# Patient Record
Sex: Female | Born: 1970 | Race: White | Hispanic: No | Smoking: Current every day smoker
Health system: Southern US, Community
[De-identification: ages and names within clinical notes are randomized; demographics above are authoritative.]

## PROBLEM LIST (undated history)

## (undated) ENCOUNTER — Inpatient Hospital Stay (HOSPITAL_COMMUNITY): Payer: Self-pay

## (undated) DIAGNOSIS — O039 Complete or unspecified spontaneous abortion without complication: Secondary | ICD-10-CM

## (undated) DIAGNOSIS — Z8489 Family history of other specified conditions: Secondary | ICD-10-CM

## (undated) DIAGNOSIS — N390 Urinary tract infection, site not specified: Secondary | ICD-10-CM

## (undated) HISTORY — PX: INDUCED ABORTION: SHX677

---

## 1997-05-10 ENCOUNTER — Observation Stay (HOSPITAL_COMMUNITY): Admission: AD | Admit: 1997-05-10 | Discharge: 1997-05-11 | Payer: Self-pay | Admitting: Obstetrics

## 1998-10-05 ENCOUNTER — Encounter: Payer: Self-pay | Admitting: *Deleted

## 1998-10-05 ENCOUNTER — Inpatient Hospital Stay (HOSPITAL_COMMUNITY): Admission: AD | Admit: 1998-10-05 | Discharge: 1998-10-08 | Payer: Self-pay | Admitting: *Deleted

## 1998-10-16 ENCOUNTER — Inpatient Hospital Stay (HOSPITAL_COMMUNITY): Admission: AD | Admit: 1998-10-16 | Discharge: 1998-10-17 | Payer: Self-pay | Admitting: Obstetrics & Gynecology

## 1998-10-16 ENCOUNTER — Encounter (INDEPENDENT_AMBULATORY_CARE_PROVIDER_SITE_OTHER): Payer: Self-pay | Admitting: Specialist

## 2002-08-06 ENCOUNTER — Emergency Department (HOSPITAL_COMMUNITY): Admission: EM | Admit: 2002-08-06 | Discharge: 2002-08-06 | Payer: Self-pay | Admitting: Emergency Medicine

## 2003-10-05 ENCOUNTER — Inpatient Hospital Stay (HOSPITAL_COMMUNITY): Admission: AD | Admit: 2003-10-05 | Discharge: 2003-10-07 | Payer: Self-pay | Admitting: Family Medicine

## 2006-02-28 HISTORY — PX: KNEE CARTILAGE SURGERY: SHX688

## 2006-03-14 ENCOUNTER — Emergency Department (HOSPITAL_COMMUNITY): Admission: EM | Admit: 2006-03-14 | Discharge: 2006-03-14 | Payer: Self-pay | Admitting: Emergency Medicine

## 2006-03-24 ENCOUNTER — Inpatient Hospital Stay (HOSPITAL_COMMUNITY): Admission: RE | Admit: 2006-03-24 | Discharge: 2006-03-28 | Payer: Self-pay | Admitting: Orthopedic Surgery

## 2006-05-10 ENCOUNTER — Encounter: Admission: RE | Admit: 2006-05-10 | Discharge: 2006-08-08 | Payer: Self-pay | Admitting: Orthopedic Surgery
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
## 2007-08-26 IMAGING — CR DG KNEE 1-2V PORT*L*
2 series · 2 of 2 positions shown · non-contrast
Comparison: none

CLINICAL DATA: Status post fixation of tibial fracture.
 PORTABLE LEFT KNEE ? 2 VIEWS ? 03/24/06:

[view not recorded (1 of 2)]
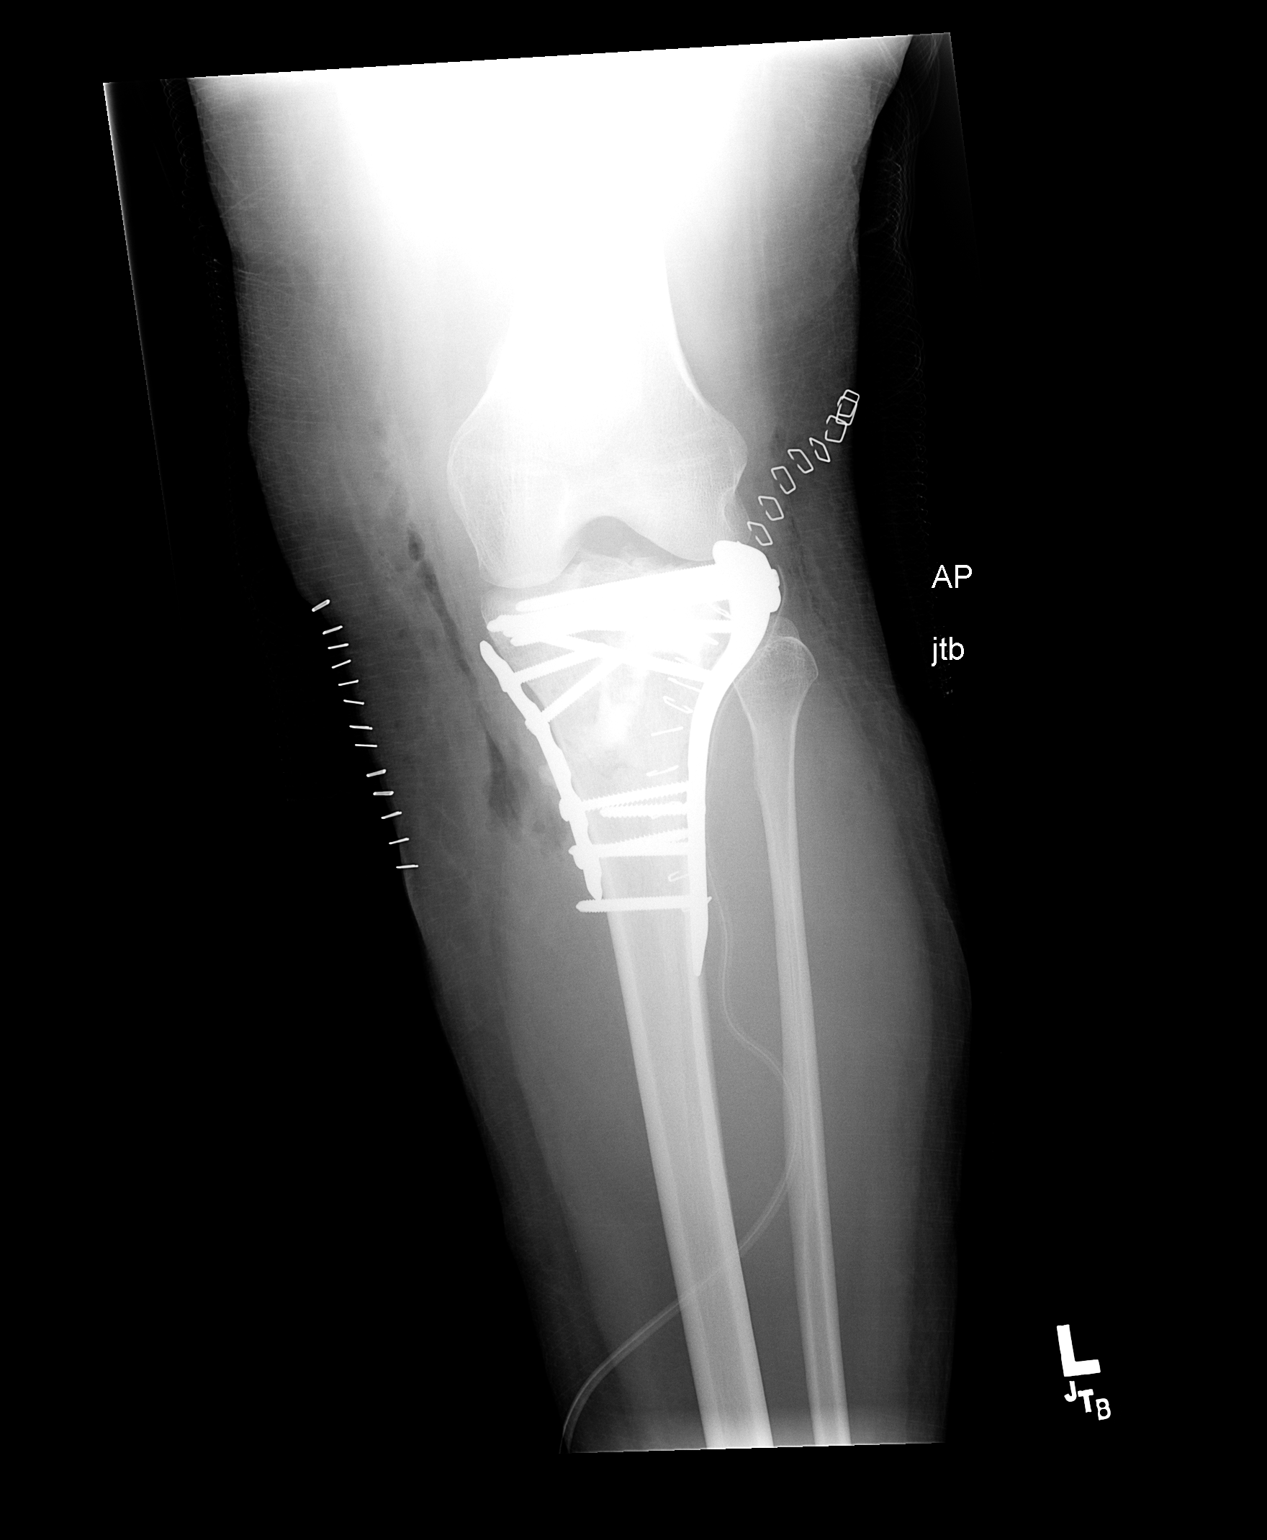

[view not recorded (2 of 2)]
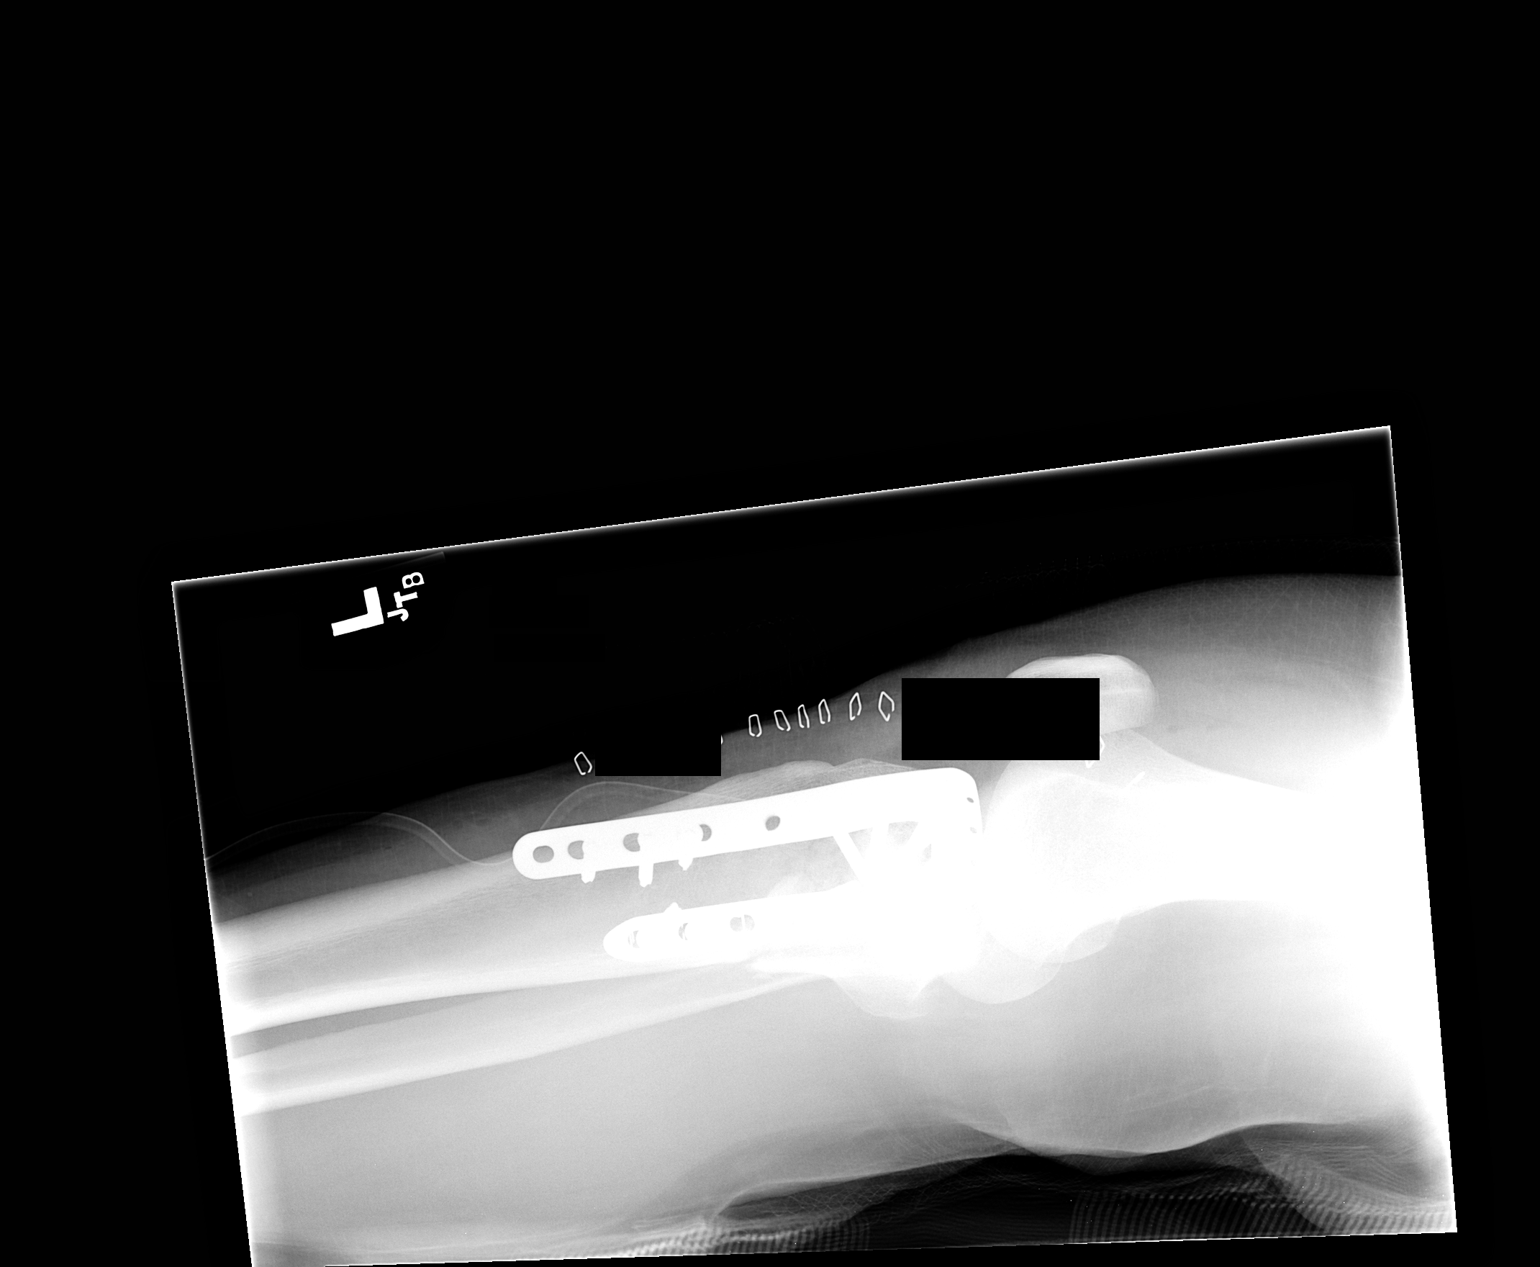

[2 of 2 positions shown; findings below may reference images not displayed]

FINDINGS: AP and lateral views of the left knee demonstrate medial and lateral buttress plate and screws fixing a comminuted tibial plateau fracture.  A surgical drain and stables are in place.
IMPRESSION: Status post ORIF of tibial plateau fracture.

## 2007-10-30 ENCOUNTER — Emergency Department (HOSPITAL_COMMUNITY): Admission: EM | Admit: 2007-10-30 | Discharge: 2007-10-30 | Payer: Self-pay | Admitting: Emergency Medicine

## 2008-04-28 ENCOUNTER — Emergency Department (HOSPITAL_COMMUNITY): Admission: EM | Admit: 2008-04-28 | Discharge: 2008-04-28 | Payer: Self-pay | Admitting: Emergency Medicine

## 2008-05-27 ENCOUNTER — Emergency Department (HOSPITAL_COMMUNITY): Admission: EM | Admit: 2008-05-27 | Discharge: 2008-05-27 | Payer: Self-pay | Admitting: Emergency Medicine

## 2008-06-20 ENCOUNTER — Emergency Department (HOSPITAL_COMMUNITY): Admission: EM | Admit: 2008-06-20 | Discharge: 2008-06-20 | Payer: Self-pay | Admitting: Emergency Medicine

## 2008-07-02 ENCOUNTER — Emergency Department (HOSPITAL_COMMUNITY): Admission: EM | Admit: 2008-07-02 | Discharge: 2008-07-02 | Payer: Self-pay | Admitting: Emergency Medicine

## 2008-07-18 ENCOUNTER — Other Ambulatory Visit: Admission: RE | Admit: 2008-07-18 | Discharge: 2008-07-18 | Payer: Self-pay | Admitting: Obstetrics & Gynecology

## 2008-09-18 ENCOUNTER — Ambulatory Visit: Payer: Self-pay | Admitting: Advanced Practice Midwife

## 2008-09-18 ENCOUNTER — Inpatient Hospital Stay (HOSPITAL_COMMUNITY): Admission: AD | Admit: 2008-09-18 | Discharge: 2008-09-18 | Payer: Self-pay | Admitting: Obstetrics & Gynecology

## 2008-10-02 ENCOUNTER — Ambulatory Visit (HOSPITAL_COMMUNITY): Admission: RE | Admit: 2008-10-02 | Discharge: 2008-10-02 | Payer: Self-pay | Admitting: Obstetrics & Gynecology

## 2008-11-03 ENCOUNTER — Emergency Department (HOSPITAL_COMMUNITY): Admission: EM | Admit: 2008-11-03 | Discharge: 2008-11-03 | Payer: Self-pay | Admitting: Emergency Medicine

## 2009-01-09 ENCOUNTER — Inpatient Hospital Stay (HOSPITAL_COMMUNITY): Admission: AD | Admit: 2009-01-09 | Discharge: 2009-01-09 | Payer: Self-pay | Admitting: Obstetrics and Gynecology

## 2009-01-09 ENCOUNTER — Ambulatory Visit: Payer: Self-pay | Admitting: Physician Assistant

## 2009-01-18 ENCOUNTER — Inpatient Hospital Stay (HOSPITAL_COMMUNITY): Admission: AD | Admit: 2009-01-18 | Discharge: 2009-01-20 | Payer: Self-pay | Admitting: Obstetrics and Gynecology

## 2009-11-22 IMAGING — US US OB TRANSVAGINAL MODIFY
1 series · 14 of 28 positions shown · non-contrast
Comparison: None

CLINICAL DATA: Early pregnancy.  Vaginal bleeding.  Uncertain
menstrual dates.

OBSTETRIC <14 WK US AND TRANSVAGINAL OB US
TECHNIQUE: Both transabdominal and transvaginal ultrasound
examinations were performed for complete evaluation of the
gestation as well as the maternal uterus, adnexal regions, and
pelvic cul-de-sac.

[Series 1: us ob transvaginal modify · 0.28mm/px · 14 of 66 slices shown]
[im 3/66]
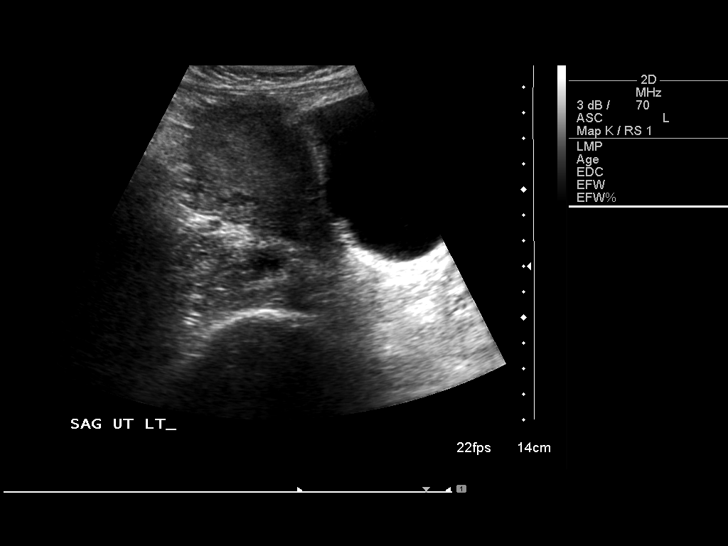
[im 8/66]
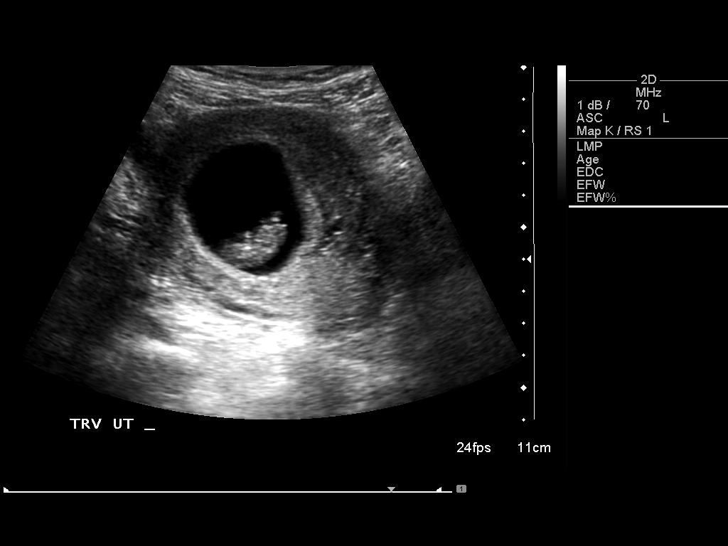
[im 13/66]
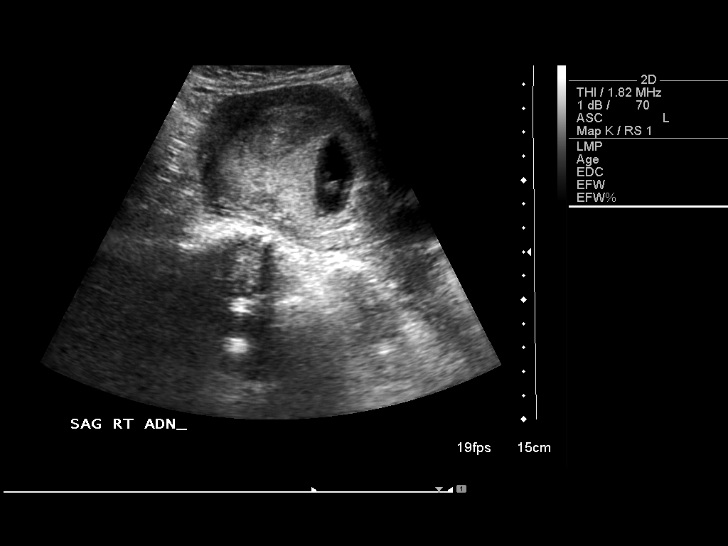
[im 17/66]
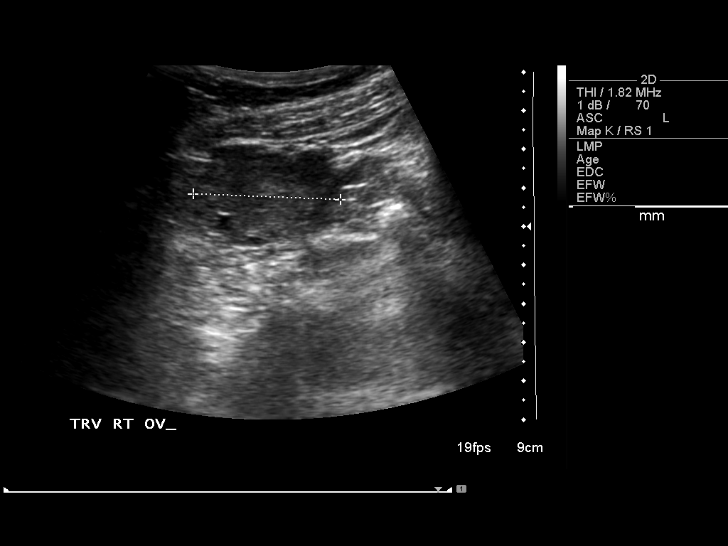
[im 22/66]
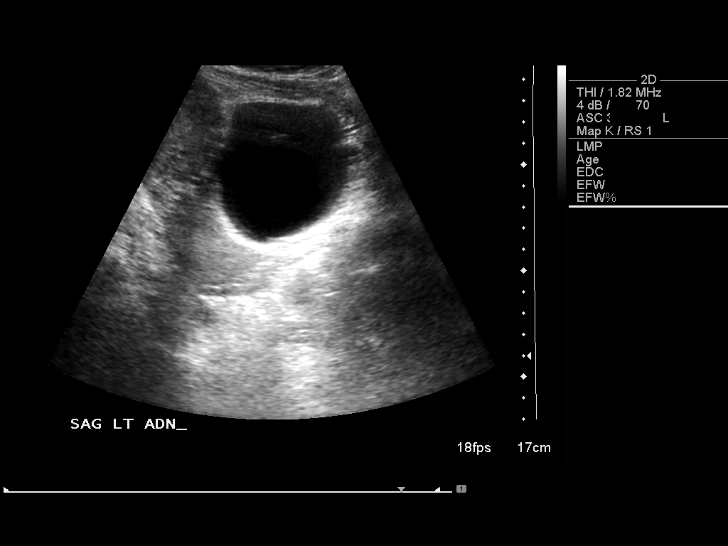
[im 27/66]
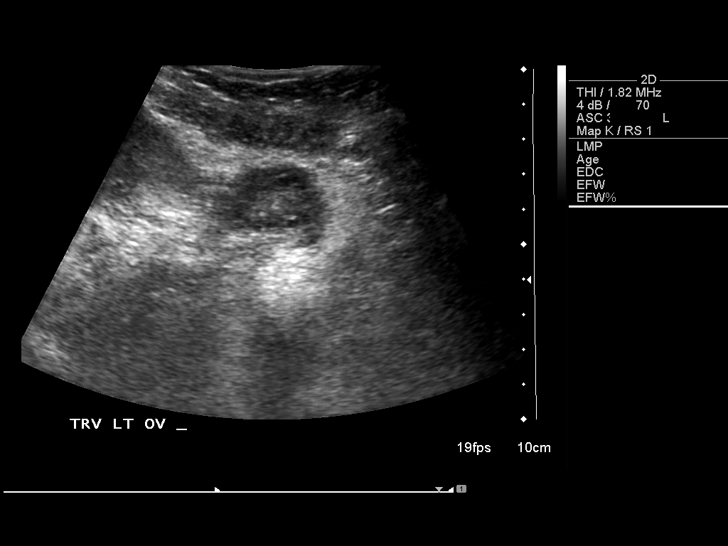
[im 32/66]
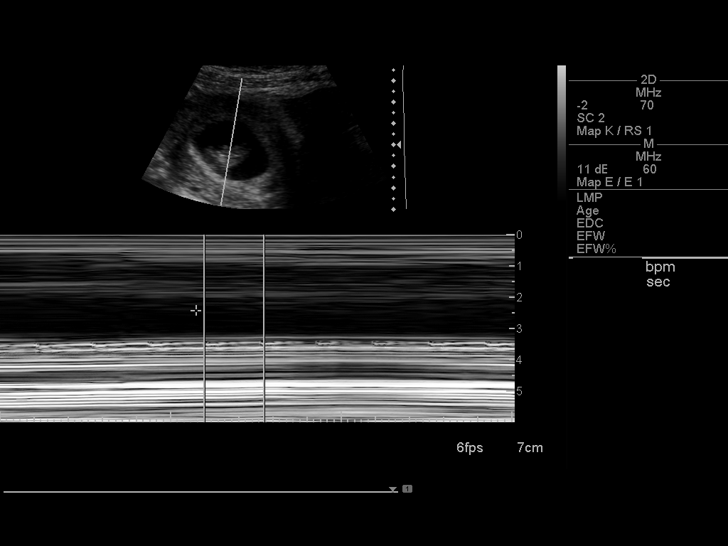
[im 37/66]
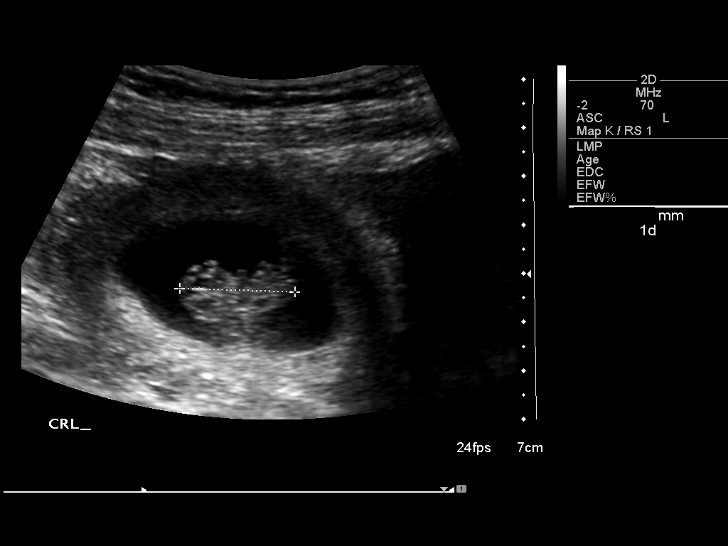
[im 41/66]
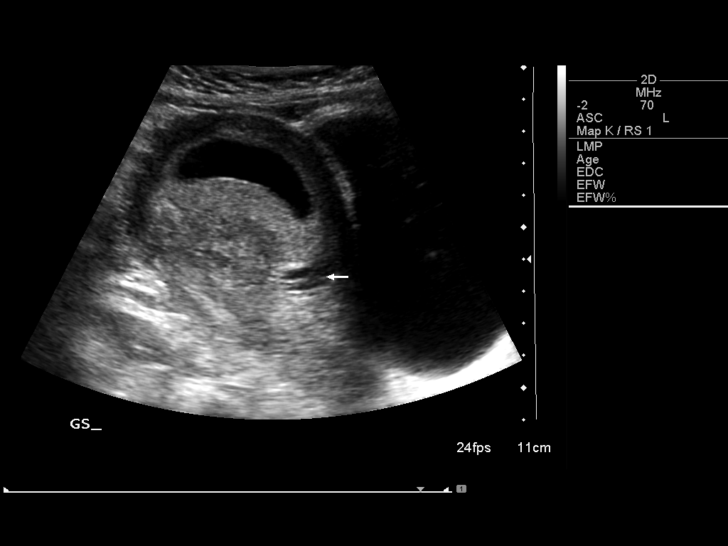
[im 46/66]
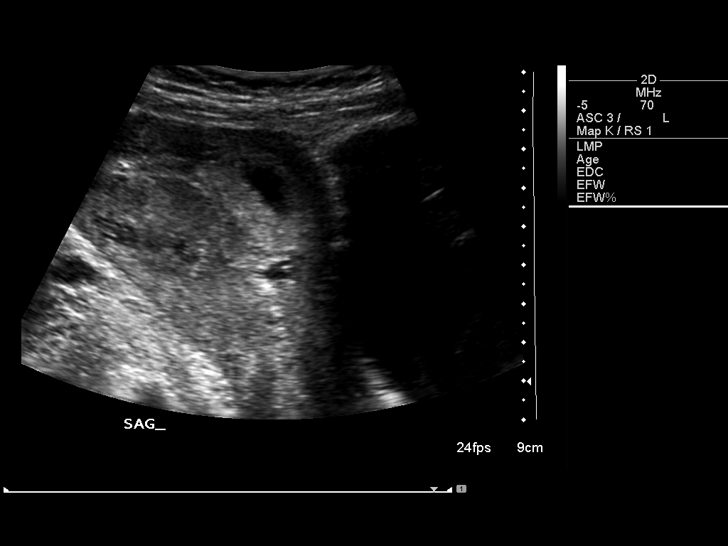
[im 51/66]
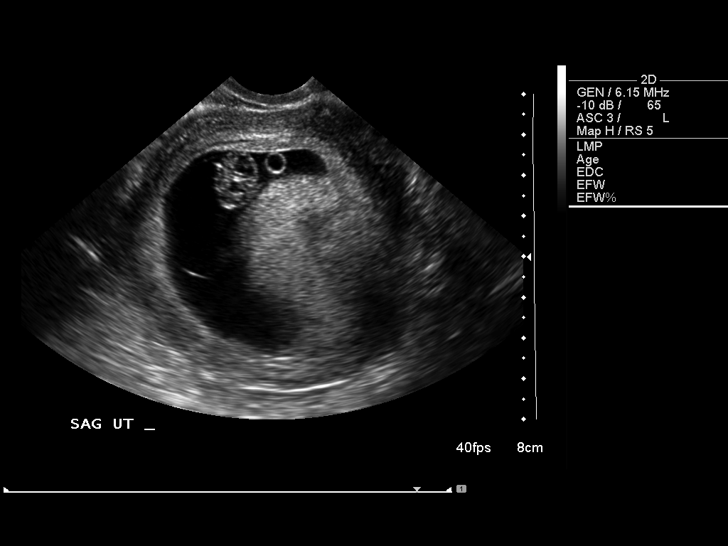
[im 56/66]
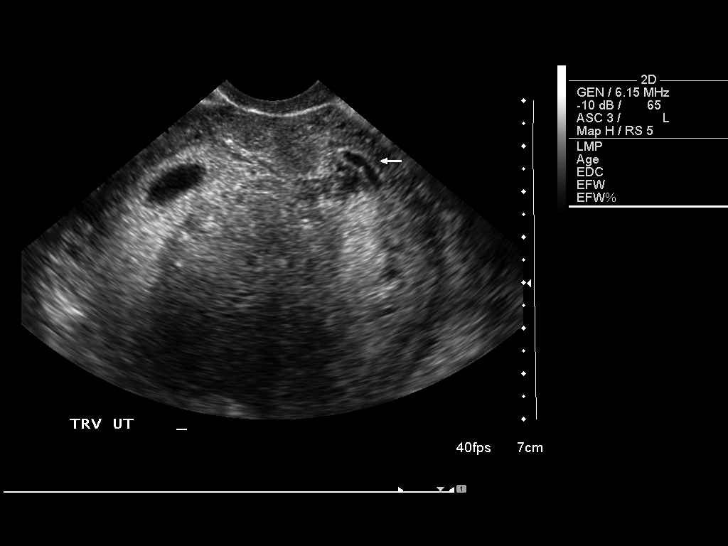
[im 61/66]
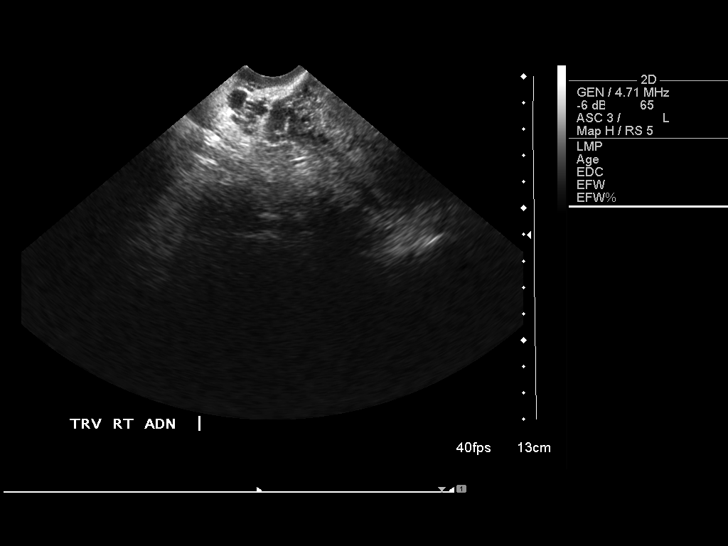
[im 66/66]
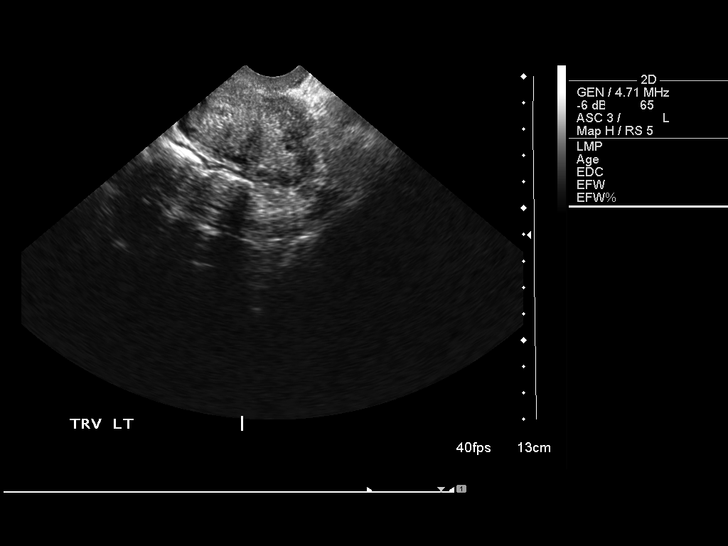

[14 of 28 positions shown; findings below may reference images not displayed]

Intrauterine gestational sac: Single
Yolk sac: Visualized
Embryo: Visualized
Cardiac Activity: Visualized
Heart Rate: 178 bpm

CRL: 2.4 cm           9   w  1   d        US EDC: 01/22/2009

Maternal uterus/adnexae:
No fibroids  identified.  Both ovaries are normal in appearance.
No adnexal mass or free fluid identified.
IMPRESSION: 1.  Single living IUP with estimated gestational age of 9 weeks 1
day and US EDC of 01/22/2009.
2.  No maternal uterine or adnexal abnormality identified.

## 2010-03-22 ENCOUNTER — Encounter: Payer: Self-pay | Admitting: Obstetrics & Gynecology

## 2010-06-02 LAB — T.PALLIDUM AB, IGG: T pallidum Antibodies (TP-PA): 45.5 IV — ABNORMAL HIGH (ref <1.0)

## 2010-06-02 LAB — CBC
HCT: 27.6 % — ABNORMAL LOW (ref 36.0–46.0)
HCT: 32.9 % — ABNORMAL LOW (ref 36.0–46.0)
MCV: 87.4 fL (ref 78.0–100.0)
MCV: 88.4 fL (ref 78.0–100.0)
RDW: 13.3 % (ref 11.5–15.5)
RDW: 13.3 % (ref 11.5–15.5)
WBC: 11.4 10*3/uL — ABNORMAL HIGH (ref 4.0–10.5)

## 2010-06-02 LAB — RPR: RPR Ser Ql: REACTIVE — AB

## 2010-06-02 LAB — RPR TITER: RPR Titer: 1:1 {titer}

## 2010-06-04 LAB — URINALYSIS, ROUTINE W REFLEX MICROSCOPIC
Bilirubin Urine: NEGATIVE
Glucose, UA: NEGATIVE mg/dL
Hgb urine dipstick: NEGATIVE
Ketones, ur: NEGATIVE mg/dL
Nitrite: NEGATIVE
Protein, ur: NEGATIVE mg/dL
Specific Gravity, Urine: 1.02 (ref 1.005–1.030)
Urobilinogen, UA: 0.2 mg/dL (ref 0.0–1.0)

## 2010-06-08 LAB — WET PREP, GENITAL
Clue Cells Wet Prep HPF POC: NONE SEEN
Trich, Wet Prep: NONE SEEN
WBC, Wet Prep HPF POC: NONE SEEN
Yeast Wet Prep HPF POC: NONE SEEN

## 2010-06-09 LAB — WET PREP, GENITAL

## 2010-06-09 LAB — BASIC METABOLIC PANEL
Chloride: 99 mEq/L (ref 96–112)
Creatinine, Ser: 0.51 mg/dL (ref 0.4–1.2)
GFR calc Af Amer: >60 mL/min (ref >60)
Glucose, Bld: 70 mg/dL (ref 70–99)
Potassium: 3.7 mEq/L (ref 3.5–5.1)
Sodium: 134 mEq/L — ABNORMAL LOW (ref 135–145)

## 2010-06-09 LAB — URINALYSIS, ROUTINE W REFLEX MICROSCOPIC
Ketones, ur: NEGATIVE mg/dL
Nitrite: NEGATIVE
Urobilinogen, UA: 0.2 mg/dL (ref 0.0–1.0)

## 2010-06-09 LAB — RH IMMUNE GLOBULIN WORKUP (NOT WOMEN'S HOSP)
ABO/RH(D): AB NEG
Weak D: NEGATIVE

## 2010-06-09 LAB — ABO/RH: ABO/RH(D): AB NEG

## 2010-06-09 LAB — URINE MICROSCOPIC-ADD ON

## 2010-06-09 LAB — CBC
HCT: 38.4 % (ref 36.0–46.0)
Hemoglobin: 13.6 g/dL (ref 12.0–15.0)
MCV: 90.1 fL (ref 78.0–100.0)
RBC: 4.26 MIL/uL (ref 3.87–5.11)
RDW: 13.3 % (ref 11.5–15.5)

## 2010-06-09 LAB — DIFFERENTIAL
Basophils Relative: 1 % (ref 0–1)
Eosinophils Absolute: 0.1 10*3/uL (ref 0.0–0.7)
Lymphocytes Relative: 20 % (ref 12–46)
Lymphs Abs: 1.8 10*3/uL (ref 0.7–4.0)
Monocytes Relative: 8 % (ref 3–12)
Neutrophils Relative %: 71 % (ref 43–77)

## 2010-06-09 LAB — GC/CHLAMYDIA PROBE AMP, GENITAL: Chlamydia, DNA Probe: NEGATIVE

## 2010-06-10 LAB — COMPREHENSIVE METABOLIC PANEL
ALT: 13 U/L (ref 0–35)
AST: 17 U/L (ref 0–37)
Calcium: 8.9 mg/dL (ref 8.4–10.5)
GFR calc Af Amer: >60 mL/min (ref >60)
Sodium: 139 mEq/L (ref 135–145)
Total Protein: 6.4 g/dL (ref 6.0–8.3)

## 2010-06-10 LAB — DIFFERENTIAL
Eosinophils Absolute: 0.1 10*3/uL (ref 0.0–0.7)
Eosinophils Relative: 2 % (ref 0–5)
Lymphs Abs: 0.7 10*3/uL (ref 0.7–4.0)
Monocytes Relative: 4 % (ref 3–12)
Neutrophils Relative %: 87 % — ABNORMAL HIGH (ref 43–77)

## 2010-06-10 LAB — PREGNANCY, URINE: Preg Test, Ur: NEGATIVE

## 2010-06-10 LAB — CBC
MCHC: 35.2 g/dL (ref 30.0–36.0)
Platelets: 182 10*3/uL (ref 150–400)
RDW: 12.7 % (ref 11.5–15.5)

## 2010-06-10 LAB — URINALYSIS, ROUTINE W REFLEX MICROSCOPIC
Bilirubin Urine: NEGATIVE
Nitrite: NEGATIVE
Specific Gravity, Urine: 1.025 (ref 1.005–1.030)
pH: 5.5 (ref 5.0–8.0)

## 2010-07-16 NOTE — Discharge Summary (Signed)
NAMEJACQUITA, Deborah Arnold               ACCOUNT NO.:  [id number]   MEDICAL RECORD NO.:  [id number]          PATIENT TYPE:  INP   LOCATION:  5001                         FACILITY:  [facility]   PHYSICIAN:  Doralee Albino. Carola Frost, M.D. DATE OF BIRTH:  [birth year]   DATE OF ADMISSION:  03/24/2006  DATE OF DISCHARGE:  03/28/2006                               DISCHARGE SUMMARY   DISCHARGE DIAGNOSES:  1. Right bi-condylar tibial plateau fracture.  2. Right lateral meniscus detachment.   PROCEDURES PERFORMED:  March 24, 2006:  ORIF of bi-condylar tibial  plateau and repair of lateral meniscus.   BRIEF SUMMARY OF HOSPITAL COURSE:  [name] was admitted where she  underwent procedures described above.  She did not have any  postoperative complications but was somewhat slow to mobilize and also  had some difficulty with pain control.  This required switching from  standard narcotic regimen to OxyContin with Percocet for breakthrough  pain.  She tolerated this transition well and by post-op day 4 was  deemed stable for discharge from a pain perspective.  At that time she  had had her Foley catheter discontinued, she was mobilizing well with  Physical Therapy, and had achieved full extension of the knee.  Her  wound was noted to be clean, dry, and intact with just minimal scant  drainage.  On the date of discharge, a UA was obtained prior to removal  of the catheter at that time, the UA was negative.  She remained non-  weightbearing on the left lower extremity with Lovenox for DVT  prophylaxis.   DISCHARGE INSTRUCTIONS:  [name] is to contact the office with any  problems, concerns, or questions regarding her wound reactivities.  She  will be non-weightbearing on the left lower extremity with unrestricted  active and passive range of motion of the left knee and ankle.  Once she  is sufficiently mobile and has adequately gained extension, she may wear  the hinge range of motion brace for extra support.   She may shower when  there is no drainage.  She will continue to take Ativan as prescribed by  her other physician.  She will take another 10 days of Lovenox for DVT  prophylaxis.  She is to take 5 days of OxyContin, 20 mg tablets,  Percocet 60 tablets for breakthrough pain, 1 q.6, Robaxin 50 tablets  p.r.n. muscle spasms, and she will return for followup to the office in  10 days for removal of her staples.      Doralee Albino. Carola Frost, M.D.  Electronically Signed     MHH/MEDQ  D:  03/28/2006  T:  03/29/2006  Job:  [id number]
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
## 2010-08-20 ENCOUNTER — Encounter: Payer: Self-pay | Admitting: Family Medicine

## 2011-06-27 ENCOUNTER — Encounter (HOSPITAL_COMMUNITY): Payer: Self-pay | Admitting: *Deleted

## 2011-06-27 ENCOUNTER — Emergency Department (HOSPITAL_COMMUNITY)
Admission: EM | Admit: 2011-06-27 | Discharge: 2011-06-27 | Disposition: A | Discharge status: Home / Self Care | Payer: Medicaid Other | Attending: Emergency Medicine | Admitting: Emergency Medicine

## 2011-06-27 DIAGNOSIS — IMO0001 Reserved for inherently not codable concepts without codable children: Secondary | ICD-10-CM | POA: Insufficient documentation

## 2011-06-27 DIAGNOSIS — J3489 Other specified disorders of nose and nasal sinuses: Secondary | ICD-10-CM | POA: Insufficient documentation

## 2011-06-27 DIAGNOSIS — J039 Acute tonsillitis, unspecified: Secondary | ICD-10-CM | POA: Insufficient documentation

## 2011-06-27 MED ORDER — HYDROCODONE-ACETAMINOPHEN 5-325 MG PO TABS: 1.0000 | ORAL_TABLET | ORAL | Status: AC | PRN

## 2011-06-27 MED ORDER — HYDROCODONE-ACETAMINOPHEN 5-325 MG PO TABS
1.0000 | ORAL_TABLET | Freq: Once | ORAL | Status: AC
  Administered 2011-06-27: 1 via ORAL
  Filled 2011-06-27: qty 1

## 2011-06-27 MED ORDER — AMOXICILLIN 500 MG PO CAPS: 500.0000 mg | ORAL_CAPSULE | Freq: Three times a day (TID) | ORAL | Status: AC

## 2011-06-27 NOTE — ED Notes (Signed)
Sore throat, fever,

## 2011-06-27 NOTE — ED Notes (Signed)
Discharge instructions reviewed with pt, questions answered. Pt verbalized understanding.  

## 2011-06-27 NOTE — ED Provider Notes (Signed)
History     CSN: [id number]  Arrival date & time 06/27/11  1127   First MD Initiated Contact with Patient 06/27/11 1141      Chief Complaint  Patient presents with  . Sore Throat    (Consider location/radiation/quality/duration/timing/severity/associated sxs/prior treatment) HPI Comments: Patient presents for evaluation of sore throat and subjective fever which started yesterday morning.  She has also had clear nasal rhinorrhea and bilateral ear and neck pain with swallowing glands in her neck.  She had increasing pain with attempts to swallow, denies any shortness of breath, wheezing, stridor.  She has taken ibuprofen with moderate improvement in pain.    Patient is a 41 y.o. Arnold presenting with pharyngitis. The history is provided by the patient.  Sore Throat Associated symptoms include chills, myalgias and a sore throat. Pertinent negatives include no abdominal pain, arthralgias, chest pain, congestion, coughing, fever, headaches, joint swelling, nausea, neck pain, numbness, rash, vomiting or weakness.    History reviewed. No pertinent past medical history.  Past Surgical History  Procedure Date  . Knee cartilage surgery 1/08    History reviewed. No pertinent family history.  History  Substance Use Topics  . Smoking status: Current Some Day Smoker  . Smokeless tobacco: Not on file  . Alcohol Use: Yes    OB History    Grav Para Term Preterm Abortions TAB SAB Ect Mult Living                  Review of Systems  Constitutional: Positive for chills. Negative for fever.  HENT: Positive for sore throat and rhinorrhea. Negative for congestion and neck pain.   Eyes: Negative.   Respiratory: Negative for cough, chest tightness, shortness of breath and wheezing.   Cardiovascular: Negative for chest pain.  Gastrointestinal: Negative for nausea, vomiting and abdominal pain.  Genitourinary: Negative.   Musculoskeletal: Positive for myalgias. Negative for joint swelling and  arthralgias.  Skin: Negative.  Negative for rash and wound.  Neurological: Negative for dizziness, weakness, light-headedness, numbness and headaches.  Hematological: Negative.   Psychiatric/Behavioral: Negative.     Allergies  Review of patient's allergies indicates no known allergies.  Home Medications   Current Outpatient Rx  Name Route Sig Dispense Refill  . AMOXICILLIN 500 MG PO CAPS Oral Take 1 capsule (500 mg total) by mouth 3 (three) times daily. 21 capsule 0  . HYDROCODONE-ACETAMINOPHEN 5-325 MG PO TABS Oral Take 1 tablet by mouth every 4 (four) hours as needed for pain. 12 tablet 0    BP 123/76  Pulse 106  Temp(Src) 98.9 F (37.2 C) (Oral)  Resp Deborah  Ht 5\' 3"  (1.6 m)  Wt 187 lb (84.823 kg)  BMI 33.13 kg/m2  SpO2 99%  LMP 05/30/2011  Physical Exam  Nursing note and vitals reviewed. Constitutional: She appears well-developed and well-nourished.  HENT:  Head: Normocephalic and atraumatic.  Right Ear: Tympanic membrane and external ear normal.  Left Ear: Tympanic membrane and external ear normal.  Nose: Rhinorrhea present. No mucosal edema.  Mouth/Throat: Uvula is midline. Oropharyngeal exudate and posterior oropharyngeal erythema present. No posterior oropharyngeal edema or tonsillar abscesses.       Tonsils are 2+ uniformly.  Eyes: Conjunctivae are normal.  Neck: Normal range of motion.  Cardiovascular: Normal rate, regular rhythm, normal heart sounds and intact distal pulses.   Pulmonary/Chest: Effort normal and breath sounds normal. She has no wheezes.  Abdominal: Soft. Bowel sounds are normal. There is no tenderness.  Musculoskeletal: Normal range of  motion.  Neurological: She is alert.  Skin: Skin is warm and dry.  Psychiatric: She has a normal mood and affect.    ED Course  Procedures (including critical care time)  Labs Reviewed - No data to display No results found.   1. Tonsillitis with exudate       MDM  Amoxil 500 mg 3 times a day.   Hydrocodone #12.  Rest, fluids.  Continue with ibuprofen when necessary and follow P. for recheck if not improving over the next week.        Burgess Amor, PA 06/27/11 1250

## 2011-06-27 NOTE — ED Provider Notes (Signed)
Medical screening examination/treatment/procedure(s) were performed by non-physician practitioner and as supervising physician I was immediately available for consultation/collaboration.   Glynn Octave, MD 06/27/11 1520

## 2011-06-27 NOTE — Discharge Instructions (Signed)
Tonsillitis Tonsils are lumps of lymphoid tissues at the back of the throat. Each tonsil has 20 crevices (crypts). Tonsils help fight nose and throat infections and keep infection from spreading to other parts of the body for the first 18 months of life. Tonsillitis is an infection of the throat that causes the tonsils to become red, tender, and swollen. CAUSES Sudden and, if treated, temporary (acute) tonsillitis is usually caused by infection with streptococcal bacteria. Long lasting (chronic) tonsillitis occurs when the crypts of the tonsils become filled with pieces of food and bacteria, which makes it easy for the tonsils to become constantly infected. SYMPTOMS  Symptoms of tonsillitis include:  A sore throat.   White patches on the tonsils.   Fever.   Tiredness.  DIAGNOSIS Tonsillitis can be diagnosed through a physical exam. Diagnosis can be confirmed with the results of lab tests, including a throat culture. TREATMENT  The goals of tonsillitis treatment include the reduction of the severity and duration of symptoms, prevention of associated conditions, and prevention of disease transmission. Tonsillitis caused by bacteria can be treated with antibiotics. Usually, treatment with antibiotics is started before the cause of the tonsillitis is known. However, if it is determined that the cause is not bacterial, antibiotics will not treat the tonsillitis. If attacks of tonsillitis are severe and frequent, your caregiver may recommend surgery to remove the tonsils (tonsillectomy). HOME CARE INSTRUCTIONS   Rest as much as possible and get plenty of sleep.   Drink plenty of fluids. While the throat is very sore, eat soft foods or liquids, such as sherbet, soups, or instant breakfast drinks.   Eat frozen ice pops.   Older children and adults may gargle with a warm or cold liquid to help soothe the throat. Mix 1 teaspoon of salt in 1 cup of water.   Other family members who also develop a  sore throat or fever should have a medical exam or throat culture.   Only take over-the-counter or prescription medicines for pain, discomfort, or fever as directed by your caregiver.   If you are given antibiotics, take them as directed. Finish them even if you start to feel better.  SEEK MEDICAL CARE IF:   Your baby is older than 3 months with a rectal temperature of 100.5 F (38.1 C) or higher for more than 1 day.   Large, tender lumps develop in your neck.   A rash develops.   Green, yellow-brown, or bloody substance is coughed up.   You are unable to swallow liquids or food for 24 hours.   Your child is unable to swallow food or liquids for 12 hours.  SEEK IMMEDIATE MEDICAL CARE IF:   You develop any new symptoms such as vomiting, severe headache, stiff neck, chest pain, or trouble breathing or swallowing.   You have severe throat pain along with drooling or voice changes.   You have severe pain, unrelieved with recommended medications.   You are unable to fully open the mouth.   You develop redness, swelling, or severe pain anywhere in the neck.   You have a fever.   Your baby is older than 3 months with a rectal temperature of 102 F (38.9 C) or higher.   Your baby is 12 months old or younger with a rectal temperature of 100.4 F (38 C) or higher.  MAKE SURE YOU:   Understand these instructions.   Will watch your condition.   Will get help right away if you are not  doing well or get worse.  Document Released: 11/24/2004 Document Revised: 02/03/2011 Document Reviewed: 04/22/2010 [facility] Patient Information 2012 [place], [place].   You may take the hydrocodone prescribed for pain relief.  This will make you drowsy - do not drive within 4 hours of taking this medication.

## 2012-02-25 ENCOUNTER — Encounter (HOSPITAL_COMMUNITY): Payer: Self-pay

## 2012-02-25 ENCOUNTER — Emergency Department (HOSPITAL_COMMUNITY)
Admission: EM | Admit: 2012-02-25 | Discharge: 2012-02-25 | Discharge status: Against Medical Advice | Payer: Medicaid Other | Attending: Emergency Medicine | Admitting: Emergency Medicine

## 2012-02-25 DIAGNOSIS — Z9889 Other specified postprocedural states: Secondary | ICD-10-CM | POA: Insufficient documentation

## 2012-02-25 DIAGNOSIS — F17200 Nicotine dependence, unspecified, uncomplicated: Secondary | ICD-10-CM | POA: Insufficient documentation

## 2012-02-25 DIAGNOSIS — M25579 Pain in unspecified ankle and joints of unspecified foot: Secondary | ICD-10-CM | POA: Insufficient documentation

## 2012-02-25 NOTE — ED Notes (Signed)
Pt reports her last period July 1st, took home pregnancy test in august and was positive, has not gone to doctor for follow up.

## 2012-02-25 NOTE — ED Notes (Signed)
Pt reports left ankle pain for months, has not been to pmd for same.

## 2012-02-25 NOTE — ED Notes (Signed)
Pt left cussing and hollering because she was placed in a hallway bed. I assured the pt as soon as I had a room available I would move her in one. Pt still left AMA.

## 2012-02-29 NOTE — L&D Delivery Note (Signed)
Delivery Note At 4:43 PM a healthy female was delivered via Vaginal, Spontaneous Delivery (Presentation: Left Occiput Anterior).  APGAR: 9, 9; weight .   Placenta status: Intact, Spontaneous.  Cord: 3 vessels with the following complications: None.  Cord pH: n/a  Anesthesia: Epidural  Episiotomy: None Lacerations: None Suture Repair: n/a Est. Blood Loss (mL): 300  Mom to postpartum.  Baby to nursery-stable.  Kevin Fenton 05/17/2012, 5:13 PM   Evaluation and management procedures were performed by Resident physician under my supervision/collaboration. Present for 2nd stage and delivery. Chart reviewed, patient examined by me and I agree with management and plan.

## 2012-04-10 LAB — OB RESULTS CONSOLE RUBELLA ANTIBODY, IGM: Rubella: IMMUNE

## 2012-04-10 LAB — OB RESULTS CONSOLE HIV ANTIBODY (ROUTINE TESTING): HIV: NONREACTIVE

## 2012-04-10 LAB — OB RESULTS CONSOLE ABO/RH: RH Type: NEGATIVE

## 2012-04-10 LAB — OB RESULTS CONSOLE RPR: RPR: NONREACTIVE

## 2012-04-20 ENCOUNTER — Encounter: Payer: Self-pay | Admitting: *Deleted

## 2012-04-23 ENCOUNTER — Other Ambulatory Visit (HOSPITAL_COMMUNITY)
Admission: RE | Admit: 2012-04-23 | Discharge: 2012-04-23 | Disposition: A | Discharge status: Home / Self Care | Payer: Medicaid Other | Source: Ambulatory Visit | Attending: Obstetrics and Gynecology | Admitting: Obstetrics and Gynecology

## 2012-04-23 ENCOUNTER — Other Ambulatory Visit: Payer: Self-pay | Admitting: Obstetrics and Gynecology

## 2012-04-23 DIAGNOSIS — Z01419 Encounter for gynecological examination (general) (routine) without abnormal findings: Secondary | ICD-10-CM | POA: Insufficient documentation

## 2012-04-23 LAB — OB RESULTS CONSOLE GBS: GBS: NEGATIVE

## 2012-04-24 ENCOUNTER — Encounter: Payer: Self-pay | Admitting: *Deleted

## 2012-04-24 ENCOUNTER — Inpatient Hospital Stay (HOSPITAL_COMMUNITY)
Admission: AD | Admit: 2012-04-24 | Discharge: 2012-04-24 | Disposition: A | Discharge status: Home / Self Care | Payer: Medicaid Other | Source: Ambulatory Visit | Attending: Obstetrics & Gynecology | Admitting: Obstetrics & Gynecology

## 2012-04-24 ENCOUNTER — Encounter (HOSPITAL_COMMUNITY): Payer: Self-pay | Admitting: *Deleted

## 2012-04-24 DIAGNOSIS — O99891 Other specified diseases and conditions complicating pregnancy: Secondary | ICD-10-CM | POA: Insufficient documentation

## 2012-04-24 DIAGNOSIS — N949 Unspecified condition associated with female genital organs and menstrual cycle: Secondary | ICD-10-CM | POA: Insufficient documentation

## 2012-04-24 HISTORY — DX: Complete or unspecified spontaneous abortion without complication: O03.9

## 2012-04-24 HISTORY — DX: Urinary tract infection, site not specified: N39.0

## 2012-04-24 LAB — WET PREP, GENITAL
Clue Cells Wet Prep HPF POC: NONE SEEN
Trich, Wet Prep: NONE SEEN

## 2012-04-24 LAB — AMNISURE RUPTURE OF MEMBRANE (ROM) NOT AT ARMC: Amnisure ROM: NEGATIVE

## 2012-04-24 NOTE — MAU Provider Note (Signed)
Attestation of Attending Supervision of Advanced Practitioner (CNM/NP): Evaluation and management procedures were performed by the Advanced Practitioner under my supervision and collaboration.  I have reviewed the Advanced Practitioner's note and chart, and I agree with the management and plan.  HARRAWAY-SMITH, Syd Manges 7:04 PM

## 2012-04-24 NOTE — MAU Note (Signed)
Did not wear pad in, clothes not wet

## 2012-04-24 NOTE — MAU Note (Signed)
Had some cramping, took a nap, felt a gush, noted watery fluid and brown mucous discharge.  More of mucous noted after shower. Was 3 cm yesterday.

## 2012-04-24 NOTE — MAU Provider Note (Signed)
History     CSN: [id number]  Arrival date and time: 04/24/12 1613   None     Chief Complaint  Patient presents with  . ? mucous and watery discharge    HPI 42 y.o. [id number] at [redacted]w[redacted]d with gush of fluid per vagina at about 2 pm today. Was on toilet and reached into vagina and felt wet gush. Also noticed mucous with brownish color to it. Had some mild cramping earlier to day that lasted less than an hour that has since resolved. Was seen for prenatal visit yesterday and checked - 3 cm. Had GBS and GC/Chlamydia done at that time.  Receives prenatal care at [facility] since 30 weeks. No complications of this pregnancy. History of 30-week preterm delivery with prior pregnancy (PPROM) 14 years ago, but last 2 deliveries were term NSVD.   OB History   Grav Para Term Preterm Abortions TAB SAB Ect Mult Living   8 4 3 1 3 2 1   4       Past Medical History  Diagnosis Date  . Preterm labor   . Abortion     x2  . Urinary tract infection     Past Surgical History  Procedure Laterality Date  . Knee cartilage surgery  1/08  . Induced abortion      x2    Family History  Problem Relation Age of Onset  . Cancer Other     Breast-maternal side    History  Substance Use Topics  . Smoking status: Current Some Day Smoker -- 26 years - last cigarette 1-2 weeks ago. Prior to that smoking 3 or 4 times a week.    Types: Cigarettes  . Smokeless tobacco: Never Used  . Alcohol Use: 0.6 oz/week    1 Cans of beer per week     Comment: "1 beer a week for constipation" per patient-in early pregnancy    Allergies: No Known Allergies  Prescriptions prior to admission  Medication Sig Dispense Refill  . Prenatal Vit-Fe Fumarate-FA (PRENATAL MULTIVITAMIN) TABS Take 1 tablet by mouth daily.        Review of Systems  Constitutional: Negative for fever and chills.  Eyes: Negative for blurred vision and double vision.  Respiratory: Negative for shortness of breath.   Cardiovascular: Negative  for chest pain.  Gastrointestinal: Negative for nausea, vomiting, abdominal pain, diarrhea and constipation.  Genitourinary: Negative for dysuria and urgency.  Neurological: Negative for dizziness and headaches.   Physical Exam   Blood pressure 130/74, pulse 81, temperature 99 F (37.2 C), temperature source Oral, resp. rate 18, last menstrual period 08/29/2011.  Physical Exam  Constitutional: She is oriented to person, place, and time. She appears well-developed and well-nourished. No distress.  HENT:  Head: Normocephalic and atraumatic.  Eyes: Conjunctivae and EOM are normal.  Neck: Normal range of motion. Neck supple.  Cardiovascular: Normal rate, regular rhythm and normal heart sounds.   Respiratory: Effort normal and breath sounds normal. No respiratory distress.  GI: Soft. Bowel sounds are normal. There is no tenderness. There is no rebound and no guarding.  Genitourinary: Vagina normal. Vaginal discharge: foamy brownish mucous.  Musculoskeletal: Normal range of motion. She exhibits no edema and no tenderness.  Neurological: She is alert and oriented to person, place, and time.  Skin: Skin is warm and dry.  Psychiatric: She has a normal mood and affect.   FHTs:  135-140, moderate variability, accels present, no decels - CAT I TOCO:  irritability  Fern negative  Results for orders placed during the hospital encounter of 04/24/12 (from the past 24 hour(s))  WET PREP, GENITAL     Status: Abnormal   Collection Time    04/24/12  4:56 PM      Result Value Range   Yeast Wet Prep HPF POC NONE SEEN  NONE SEEN   Trich, Wet Prep NONE SEEN  NONE SEEN   Clue Cells Wet Prep HPF POC NONE SEEN  NONE SEEN   WBC, Wet Prep HPF POC FEW (*) NONE SEEN  AMNISURE RUPTURE OF MEMBRANE (ROM)     Status: None   Collection Time    04/24/12  5:07 PM      Result Value Range   Amnisure ROM NEGATIVE      MAU Course  Procedures   Assessment and Plan  42 y.o. [id number] at [redacted]w[redacted]d with possible  PPROM - Amnisure and fern negative - Wet prep negative - Discharge home - F/u at [facility] as scheduled    Napoleon Form 04/24/2012, 4:44 PM

## 2012-05-16 ENCOUNTER — Telehealth: Payer: Self-pay | Admitting: Adult Health

## 2012-05-16 NOTE — Telephone Encounter (Signed)
Called home number for contact. Pt stated had light spotting and brownish discharge. Pt. Also stated had appt Monday 05/14/2012 had a pelvic exam, dilated 2-3 cm. Per Drenda Freeze, CNM normal to have light spotting after pelvic exam and normal to have brownish discharge. Pt informed to go to [facility] if bleeding increase or contractions 5-10 min apart. Pt verbalized understanding and stated had next ob appt. On 05/21/2012.

## 2012-05-17 ENCOUNTER — Encounter (HOSPITAL_COMMUNITY): Payer: Self-pay | Admitting: Anesthesiology

## 2012-05-17 ENCOUNTER — Inpatient Hospital Stay (HOSPITAL_COMMUNITY): Payer: Medicaid Other | Admitting: Anesthesiology

## 2012-05-17 ENCOUNTER — Inpatient Hospital Stay (HOSPITAL_COMMUNITY)
Admission: AD | Admit: 2012-05-17 | Discharge: 2012-05-19 | DRG: 767 | Disposition: A | Discharge status: Home / Self Care | Payer: Medicaid Other | Source: Ambulatory Visit | Attending: Obstetrics & Gynecology | Admitting: Obstetrics & Gynecology

## 2012-05-17 ENCOUNTER — Encounter (HOSPITAL_COMMUNITY): Payer: Self-pay | Admitting: *Deleted

## 2012-05-17 DIAGNOSIS — O0930 Supervision of pregnancy with insufficient antenatal care, unspecified trimester: Secondary | ICD-10-CM

## 2012-05-17 DIAGNOSIS — Z302 Encounter for sterilization: Secondary | ICD-10-CM

## 2012-05-17 DIAGNOSIS — O98519 Other viral diseases complicating pregnancy, unspecified trimester: Secondary | ICD-10-CM | POA: Diagnosis present

## 2012-05-17 DIAGNOSIS — O09529 Supervision of elderly multigravida, unspecified trimester: Secondary | ICD-10-CM

## 2012-05-17 DIAGNOSIS — A6000 Herpesviral infection of urogenital system, unspecified: Secondary | ICD-10-CM | POA: Diagnosis present

## 2012-05-17 LAB — CBC
HCT: 35.3 % — ABNORMAL LOW (ref 36.0–46.0)
Hemoglobin: 12.1 g/dL (ref 12.0–15.0)
MCH: 30.5 pg (ref 26.0–34.0)
MCV: 88.9 fL (ref 78.0–100.0)
RBC: 3.97 MIL/uL (ref 3.87–5.11)

## 2012-05-17 MED ORDER — PRENATAL MULTIVITAMIN CH: 1.0000 | ORAL_TABLET | Freq: Every day | ORAL | Status: DC

## 2012-05-17 MED ORDER — LACTATED RINGERS IV SOLN: 500.0000 mL | INTRAVENOUS | Status: DC | PRN

## 2012-05-17 MED ORDER — ONDANSETRON HCL 4 MG PO TABS: 4.0000 mg | ORAL_TABLET | ORAL | Status: DC | PRN

## 2012-05-17 MED ORDER — ZOLPIDEM TARTRATE 5 MG PO TABS: 5.0000 mg | ORAL_TABLET | Freq: Every evening | ORAL | Status: DC | PRN

## 2012-05-17 MED ORDER — CITRIC ACID-SODIUM CITRATE 334-500 MG/5ML PO SOLN: 30.0000 mL | ORAL | Status: DC | PRN

## 2012-05-17 MED ORDER — BENZOCAINE-MENTHOL 20-0.5 % EX AERO
1.0000 "application " | INHALATION_SPRAY | CUTANEOUS | Status: DC | PRN
  Administered 2012-05-18: 1 via TOPICAL
  Filled 2012-05-17: qty 56

## 2012-05-17 MED ORDER — EPHEDRINE 5 MG/ML INJ: 10.0000 mg | INTRAVENOUS | Status: DC | PRN

## 2012-05-17 MED ORDER — IBUPROFEN 600 MG PO TABS: 600.0000 mg | ORAL_TABLET | Freq: Four times a day (QID) | ORAL | Status: DC | PRN

## 2012-05-17 MED ORDER — LACTATED RINGERS IV SOLN
INTRAVENOUS | Status: DC
  Administered 2012-05-17 (×3): via INTRAVENOUS

## 2012-05-17 MED ORDER — WITCH HAZEL-GLYCERIN EX PADS: 1.0000 "application " | MEDICATED_PAD | CUTANEOUS | Status: DC | PRN

## 2012-05-17 MED ORDER — LACTATED RINGERS IV SOLN: 500.0000 mL | Freq: Once | INTRAVENOUS | Status: DC

## 2012-05-17 MED ORDER — LIDOCAINE HCL (PF) 1 % IJ SOLN
30.0000 mL | INTRAMUSCULAR | Status: DC | PRN
  Filled 2012-05-17: qty 30

## 2012-05-17 MED ORDER — LANOLIN HYDROUS EX OINT: TOPICAL_OINTMENT | CUTANEOUS | Status: DC | PRN

## 2012-05-17 MED ORDER — DIPHENHYDRAMINE HCL 50 MG/ML IJ SOLN: 12.5000 mg | INTRAMUSCULAR | Status: DC | PRN

## 2012-05-17 MED ORDER — SENNOSIDES-DOCUSATE SODIUM 8.6-50 MG PO TABS
2.0000 | ORAL_TABLET | Freq: Every day | ORAL | Status: DC
  Administered 2012-05-17 – 2012-05-18 (×2): 2 via ORAL

## 2012-05-17 MED ORDER — SIMETHICONE 80 MG PO CHEW: 80.0000 mg | CHEWABLE_TABLET | ORAL | Status: DC | PRN

## 2012-05-17 MED ORDER — OXYTOCIN 40 UNITS IN LACTATED RINGERS INFUSION - SIMPLE MED
62.5000 mL/h | INTRAVENOUS | Status: DC
  Administered 2012-05-17: 999 mL/h via INTRAVENOUS
  Administered 2012-05-17: 62.5 mL/h via INTRAVENOUS
  Filled 2012-05-17: qty 1000

## 2012-05-17 MED ORDER — OXYTOCIN BOLUS FROM INFUSION: 500.0000 mL | INTRAVENOUS | Status: DC

## 2012-05-17 MED ORDER — FENTANYL 2.5 MCG/ML BUPIVACAINE 1/10 % EPIDURAL INFUSION (WH - ANES)
14.0000 mL/h | INTRAMUSCULAR | Status: DC | PRN
  Administered 2012-05-17: 14 mL/h via EPIDURAL
  Filled 2012-05-17: qty 125

## 2012-05-17 MED ORDER — NALBUPHINE SYRINGE 5 MG/0.5 ML: 10.0000 mg | INJECTION | INTRAMUSCULAR | Status: DC | PRN

## 2012-05-17 MED ORDER — EPHEDRINE 5 MG/ML INJ
10.0000 mg | INTRAVENOUS | Status: DC | PRN
  Filled 2012-05-17: qty 4

## 2012-05-17 MED ORDER — OXYCODONE-ACETAMINOPHEN 5-325 MG PO TABS: 1.0000 | ORAL_TABLET | ORAL | Status: DC | PRN

## 2012-05-17 MED ORDER — DIPHENHYDRAMINE HCL 25 MG PO CAPS: 25.0000 mg | ORAL_CAPSULE | Freq: Four times a day (QID) | ORAL | Status: DC | PRN

## 2012-05-17 MED ORDER — FLEET ENEMA 7-19 GM/118ML RE ENEM: 1.0000 | ENEMA | RECTAL | Status: DC | PRN

## 2012-05-17 MED ORDER — FENTANYL CITRATE 0.05 MG/ML IJ SOLN
100.0000 ug | INTRAMUSCULAR | Status: DC | PRN
  Administered 2012-05-17 (×2): 100 ug via INTRAVENOUS
  Filled 2012-05-17 (×2): qty 2

## 2012-05-17 MED ORDER — DIBUCAINE 1 % RE OINT: 1.0000 "application " | TOPICAL_OINTMENT | RECTAL | Status: DC | PRN

## 2012-05-17 MED ORDER — IBUPROFEN 600 MG PO TABS
600.0000 mg | ORAL_TABLET | Freq: Four times a day (QID) | ORAL | Status: DC
  Administered 2012-05-17 – 2012-05-19 (×5): 600 mg via ORAL
  Filled 2012-05-17 (×5): qty 1

## 2012-05-17 MED ORDER — SODIUM BICARBONATE 8.4 % IV SOLN
INTRAVENOUS | Status: DC | PRN
  Administered 2012-05-17: 5 mL via EPIDURAL

## 2012-05-17 MED ORDER — TETANUS-DIPHTH-ACELL PERTUSSIS 5-2.5-18.5 LF-MCG/0.5 IM SUSP: 0.5000 mL | Freq: Once | INTRAMUSCULAR | Status: DC

## 2012-05-17 MED ORDER — ACETAMINOPHEN 325 MG PO TABS: 650.0000 mg | ORAL_TABLET | ORAL | Status: DC | PRN

## 2012-05-17 MED ORDER — PHENYLEPHRINE 40 MCG/ML (10ML) SYRINGE FOR IV PUSH (FOR BLOOD PRESSURE SUPPORT): 80.0000 ug | PREFILLED_SYRINGE | INTRAVENOUS | Status: DC | PRN

## 2012-05-17 MED ORDER — ONDANSETRON HCL 4 MG/2ML IJ SOLN
4.0000 mg | Freq: Four times a day (QID) | INTRAMUSCULAR | Status: DC | PRN
  Administered 2012-05-17: 4 mg via INTRAVENOUS
  Filled 2012-05-17: qty 2

## 2012-05-17 MED ORDER — ONDANSETRON HCL 4 MG/2ML IJ SOLN: 4.0000 mg | INTRAMUSCULAR | Status: DC | PRN

## 2012-05-17 MED ORDER — PHENYLEPHRINE 40 MCG/ML (10ML) SYRINGE FOR IV PUSH (FOR BLOOD PRESSURE SUPPORT)
80.0000 ug | PREFILLED_SYRINGE | INTRAVENOUS | Status: DC | PRN
  Filled 2012-05-17: qty 5

## 2012-05-17 MED ORDER — OXYCODONE-ACETAMINOPHEN 5-325 MG PO TABS
1.0000 | ORAL_TABLET | ORAL | Status: DC | PRN
  Administered 2012-05-17 – 2012-05-19 (×6): 1 via ORAL
  Filled 2012-05-17 (×6): qty 1

## 2012-05-17 MED ORDER — NALOXONE HCL 0.4 MG/ML IJ SOLN
INTRAMUSCULAR | Status: AC
  Filled 2012-05-17: qty 1

## 2012-05-17 NOTE — Anesthesia Procedure Notes (Signed)

## 2012-05-17 NOTE — Progress Notes (Signed)
AYSIAH JURADO is a 42 y.o. [phone] at [redacted]w[redacted]d by ultrasound admitted for active labor  Subjective: Pain relieved well with the epidural, no more complaints  Objective: BP 128/69  Pulse 96  Temp(Src) 97 F (36.1 C) (Axillary)  Resp 16  Ht 5\' 3"  (1.6 m)  Wt 94.348 kg (208 lb)  BMI 36.85 kg/m2  SpO2 99%  LMP 08/29/2011      FHT:  FHR: 130 bpm, variability: moderate,  accelerations:  Present,  decelerations:  Absent UC:   irregular, every 2-6 minutes SVE:   Dilation: 7 Effacement (%): 100 Station: -2 Exam by:: Dr. 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 Chart reviewed, patient examined by me and I agree with management and plan.

## 2012-05-17 NOTE — MAU Note (Signed)
Contractions during the night.  Getting stronger and closer.  Bloody mucous and blood tinged watery discharge.

## 2012-05-17 NOTE — Progress Notes (Signed)
NAIRA STANDIFORD is a 42 y.o. [phone] at [redacted]w[redacted]d by ultrasound admitted for active labor  Subjective: Feeling more pain  and pressure, still deciding about the epidural  Objective: BP 133/67  Pulse 70  Temp(Src) 98 F (36.7 C) (Oral)  Resp 16  Ht 5\' 3"  (1.6 m)  Wt 94.348 kg (208 lb)  BMI 36.85 kg/m2  LMP 08/29/2011      FHT:  FHR: 130 bpm, variability: moderate,  accelerations:  Present,  decelerations:  Absent UC:   irregular, every 1-3 minutes SVE:   Dilation: 6 Effacement (%): 100 Station: -2 Exam by:: Dr. Ermalinda Memos  Labs: Lab Results  Component Value Date   WBC 11.9* 05/17/2012   HGB 12.1 05/17/2012   HCT 35.3* 05/17/2012   MCV 88.9 05/17/2012   PLT 194 05/17/2012    Assessment / Plan: Spontaneous labor, progressing normally  Labor: Progressing normally, membranes intact, will plan to AROM if change is slow after baby's head engages better.  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 Chart reviewed, patient examined by me and I agree with management and plan.

## 2012-05-17 NOTE — H&P (Signed)
Deborah Arnold is a 42 y.o. female presenting for Contractions. Her contractions started this am around 4. She woke up and used the restroom when she saw some bloody/milky discharge. Her contractions have continued to get stronger until now. She denies decreased fetal movement and other vaginal discharge. She also denies change in vision, chest pain, dysuria, and swelling. She does note some air hunger, mild headaches which are normal for her, and some constipation. [facility] She drank some alcohol previous to her discovering she was pregnant, and admits to some smaller amount of alcohol use after she found out to help with constipation.   History OB History   Grav Para Term Preterm Abortions TAB SAB Ect Mult Living   8 4 3 1 3 2 1   4      Past Medical History  Diagnosis Date  . Preterm labor   . Abortion     x2  . Urinary tract infection    Past Surgical History  Procedure Laterality Date  . Knee cartilage surgery  1/08  . Induced abortion      x2   Family History: family history includes Cancer in her other. Social History:  reports that she has been smoking Cigarettes.  She has been smoking about 0.00 packs per day for the past 26 years. She has never used smokeless tobacco. She reports that she drinks about 0.6 ounces of alcohol per week. She reports that she does not use illicit drugs.   Prenatal Transfer Tool  Maternal Diabetes: No Genetic Screening: Declined Maternal Ultrasounds/Referrals: Normal Fetal Ultrasounds or other Referrals:  None Maternal Substance Abuse:  Yes:  Type: Other:  Significant Maternal Medications:  None Significant Maternal Lab Results:  Lab values include: Group B Strep negative, Other:  Other Comments:  Alocohol use, HSV 2 positive without history of lesion ever  ROS Per HPI  Dilation: 5 Effacement (%): 100 Station: -2 Exam by:: jolynn Blood pressure 138/83, pulse 96, temperature 98.7 F (37.1 C), temperature source  Oral, resp. rate 20, weight 94.348 kg (208 lb), last menstrual period 08/29/2011. Exam Physical Exam   Gen: NAD, alert, cooperative with exam HEENT: NCAT, PERRL, MMM CV: RRR, good S1/S2, no murmur Resp: CTABL, no wheezes, non-labored Abd: Soft, pregnant abdomen Ext: No edema Neuro: Alert and oriented, No gross deficits GU: no lesions on perineum with careful inspection, 2 hyperpigmented flat circular lesions 2 cm and 3cm in diameter on L thigh just inferior of inguinal fold  FHT: baseline 140, mod variability, accels present, no decels,  Toco: regular q2-3 minutes  Prenatal labs: ABO, Rh: AB/Negative/-- (02/11 0000) Antibody: Negative (02/11 0000) Rubella: Immune (02/11 0000) RPR: Nonreactive (02/11 0000)  HBsAg: Negative (02/11 0000)  HIV: Non-reactive (02/11 0000)  GBS:   Negative 04/23/2012  Assessment/Plan:  42 y/o [id number] here at 37.3 by 30.0 [place] with SOL - Spontaneous labor, membranes intact, Hx of shoulder dystocia, late prenatal care presenting at 30 weeks - GBS negative, 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  Chart reviewed, patient examined by me and I agree with management and plan.

## 2012-05-17 NOTE — Anesthesia Preprocedure Evaluation (Signed)

## 2012-05-18 ENCOUNTER — Encounter (HOSPITAL_COMMUNITY)
Admission: AD | Disposition: A | Discharge status: Home / Self Care | Payer: Self-pay | Source: Ambulatory Visit | Attending: Obstetrics & Gynecology

## 2012-05-18 ENCOUNTER — Encounter (HOSPITAL_COMMUNITY): Payer: Self-pay | Admitting: Anesthesiology

## 2012-05-18 ENCOUNTER — Inpatient Hospital Stay (HOSPITAL_COMMUNITY): Payer: Medicaid Other | Admitting: Anesthesiology

## 2012-05-18 DIAGNOSIS — Z302 Encounter for sterilization: Secondary | ICD-10-CM

## 2012-05-18 HISTORY — PX: TUBAL LIGATION: SHX77

## 2012-05-18 SURGERY — LIGATION, FALLOPIAN TUBE, POSTPARTUM
Anesthesia: Epidural | Laterality: Bilateral | Wound class: Clean

## 2012-05-18 MED ORDER — LIDOCAINE-EPINEPHRINE (PF) 2 %-1:200000 IJ SOLN
INTRAMUSCULAR | Status: AC
  Filled 2012-05-18: qty 20

## 2012-05-18 MED ORDER — SODIUM BICARBONATE 8.4 % IV SOLN
INTRAVENOUS | Status: AC
  Filled 2012-05-18: qty 50

## 2012-05-18 MED ORDER — SODIUM BICARBONATE 8.4 % IV SOLN
INTRAVENOUS | Status: DC | PRN
  Administered 2012-05-18: 3 mL via EPIDURAL

## 2012-05-18 MED ORDER — PHENYLEPHRINE 40 MCG/ML (10ML) SYRINGE FOR IV PUSH (FOR BLOOD PRESSURE SUPPORT)
PREFILLED_SYRINGE | INTRAVENOUS | Status: AC
  Filled 2012-05-18: qty 5

## 2012-05-18 MED ORDER — METOCLOPRAMIDE HCL 10 MG PO TABS
10.0000 mg | ORAL_TABLET | Freq: Once | ORAL | Status: AC
  Administered 2012-05-18: 10 mg via ORAL
  Filled 2012-05-18: qty 1

## 2012-05-18 MED ORDER — LACTATED RINGERS IV SOLN
INTRAVENOUS | Status: DC | PRN
  Administered 2012-05-18 (×3): via INTRAVENOUS

## 2012-05-18 MED ORDER — KETOROLAC TROMETHAMINE 30 MG/ML IJ SOLN: 15.0000 mg | Freq: Once | INTRAMUSCULAR | Status: AC | PRN

## 2012-05-18 MED ORDER — LACTATED RINGERS IV SOLN: INTRAVENOUS | Status: DC

## 2012-05-18 MED ORDER — CHLORHEXIDINE GLUCONATE CLOTH 2 % EX PADS
6.0000 | MEDICATED_PAD | Freq: Every day | CUTANEOUS | Status: DC
  Administered 2012-05-18: 6 via TOPICAL

## 2012-05-18 MED ORDER — FENTANYL CITRATE 0.05 MG/ML IJ SOLN
INTRAMUSCULAR | Status: AC
  Filled 2012-05-18: qty 2

## 2012-05-18 MED ORDER — MIDAZOLAM HCL 5 MG/5ML IJ SOLN
INTRAMUSCULAR | Status: DC | PRN
  Administered 2012-05-18 (×2): 1 mg via INTRAVENOUS

## 2012-05-18 MED ORDER — MUPIROCIN 2 % EX OINT
1.0000 "application " | TOPICAL_OINTMENT | Freq: Two times a day (BID) | CUTANEOUS | Status: DC
  Administered 2012-05-18 – 2012-05-19 (×3): 1 via NASAL
  Filled 2012-05-18: qty 22

## 2012-05-18 MED ORDER — EPHEDRINE 5 MG/ML INJ
INTRAVENOUS | Status: AC
  Filled 2012-05-18: qty 10

## 2012-05-18 MED ORDER — EPHEDRINE SULFATE 50 MG/ML IJ SOLN
INTRAMUSCULAR | Status: DC | PRN
  Administered 2012-05-18: 10 mg via INTRAVENOUS
  Administered 2012-05-18: 5 mg via INTRAVENOUS

## 2012-05-18 MED ORDER — BUPIVACAINE HCL (PF) 0.25 % IJ SOLN
INTRAMUSCULAR | Status: DC | PRN
  Administered 2012-05-18: 10 mL

## 2012-05-18 MED ORDER — FAMOTIDINE 20 MG PO TABS
40.0000 mg | ORAL_TABLET | Freq: Once | ORAL | Status: AC
  Administered 2012-05-18: 40 mg via ORAL
  Filled 2012-05-18: qty 2

## 2012-05-18 MED ORDER — MIDAZOLAM HCL 2 MG/2ML IJ SOLN
INTRAMUSCULAR | Status: AC
  Filled 2012-05-18: qty 2

## 2012-05-18 MED ORDER — FENTANYL CITRATE 0.05 MG/ML IJ SOLN
INTRAMUSCULAR | Status: AC
  Filled 2012-05-18: qty 5

## 2012-05-18 MED ORDER — FENTANYL CITRATE 0.05 MG/ML IJ SOLN
INTRAMUSCULAR | Status: DC | PRN
  Administered 2012-05-18: 50 ug via INTRAVENOUS

## 2012-05-18 MED ORDER — ONDANSETRON HCL 4 MG/2ML IJ SOLN
INTRAMUSCULAR | Status: DC | PRN
  Administered 2012-05-18: 4 mg via INTRAVENOUS

## 2012-05-18 MED ORDER — ONDANSETRON HCL 4 MG/2ML IJ SOLN
INTRAMUSCULAR | Status: AC
  Filled 2012-05-18: qty 2

## 2012-05-18 MED ORDER — FENTANYL CITRATE 0.05 MG/ML IJ SOLN: 25.0000 ug | INTRAMUSCULAR | Status: DC | PRN

## 2012-05-18 MED ORDER — BUPIVACAINE HCL (PF) 0.25 % IJ SOLN
INTRAMUSCULAR | Status: AC
  Filled 2012-05-18: qty 30

## 2012-05-18 MED ORDER — PHENYLEPHRINE HCL 10 MG/ML IJ SOLN
INTRAMUSCULAR | Status: DC | PRN
  Administered 2012-05-18: 80 ug via INTRAVENOUS
  Administered 2012-05-18: 40 ug via INTRAVENOUS

## 2012-05-18 SURGICAL SUPPLY — 21 items
BLADE SURG 11 STRL SS (BLADE) ×2 IMPLANT
CHLORAPREP W/TINT 26ML (MISCELLANEOUS) ×2 IMPLANT
CLIP FILSHIE TUBAL LIGA STRL (Clip) ×3 IMPLANT
CLOTH BEACON ORANGE TIMEOUT ST (SAFETY) ×2 IMPLANT
DRSG COVADERM PLUS 2X2 (GAUZE/BANDAGES/DRESSINGS) ×1 IMPLANT
GLOVE BIO SURGEON STRL SZ 6.5 (GLOVE) ×2 IMPLANT
GLOVE BIOGEL PI IND STRL 7.0 (GLOVE) ×1 IMPLANT
GLOVE BIOGEL PI INDICATOR 7.0 (GLOVE) ×1
GOWN PREVENTION PLUS LG XLONG (DISPOSABLE) ×4 IMPLANT
NDL HYPO 25X1 1.5 SAFETY (NEEDLE) ×1 IMPLANT
NEEDLE HYPO 25X1 1.5 SAFETY (NEEDLE) ×2 IMPLANT
NS IRRIG 1000ML POUR BTL (IV SOLUTION) ×2 IMPLANT
PACK ABDOMINAL MINOR (CUSTOM PROCEDURE TRAY) ×2 IMPLANT
SPONGE LAP 4X18 X RAY DECT (DISPOSABLE) IMPLANT
SUT VIC AB 0 CT1 27 (SUTURE) ×2
SUT VIC AB 0 CT1 27XBRD ANBCTR (SUTURE) ×1 IMPLANT
SUT VICRYL 4-0 PS2 18IN ABS (SUTURE) ×2 IMPLANT
SYR CONTROL 10ML LL (SYRINGE) ×2 IMPLANT
TOWEL OR 17X24 6PK STRL BLUE (TOWEL DISPOSABLE) ×4 IMPLANT
TRAY FOLEY BAG SILVER LF 14FR (CATHETERS) ×2 IMPLANT
WATER STERILE IRR 1000ML POUR (IV SOLUTION) ×2 IMPLANT

## 2012-05-18 NOTE — Transfer of Care (Signed)
Immediate Anesthesia Transfer of Care Note  Patient: Deborah Arnold  Procedure(s) Performed: Procedure(s): POST PARTUM TUBAL LIGATION (Bilateral)  Patient Location: PACU  Anesthesia Type:Epidural  Level of Consciousness: awake, alert , oriented and patient cooperative  Airway & Oxygen Therapy: Patient Spontanous Breathing  Post-op Assessment: Report given to PACU RN and Post -op Vital signs reviewed and stable  Post vital signs: stable  Complications: No apparent anesthesia complications

## 2012-05-18 NOTE — Anesthesia Postprocedure Evaluation (Signed)
Anesthesia Post Note  Patient: Deborah Arnold  Procedure(s) Performed: Procedure(s) (LRB): POST PARTUM TUBAL LIGATION (Bilateral)  Anesthesia type: Epidural  Patient location: Mother/Baby  Post pain: Pain level controlled  Post assessment: Post-op Vital signs reviewed  Last Vitals:  Filed Vitals:   05/18/12 1500  BP: 127/86  Pulse: 90  Temp: 35.8 C  Resp: 18    Post vital signs: Reviewed  Level of consciousness:alert  Complications: No apparent anesthesia complications

## 2012-05-18 NOTE — Anesthesia Preprocedure Evaluation (Addendum)
Anesthesia Evaluation  Patient identified by MRN, date of birth, ID band Patient awake    Reviewed: Allergy & Precautions, H&P , Patient's Chart, lab work & pertinent test results  Airway Mallampati: II TM Distance: >3 FB Neck ROM: full    Dental  (+) Teeth Intact   Pulmonary Current Smoker,  breath sounds clear to auscultation        Cardiovascular negative cardio ROS  Rhythm:regular Rate:Normal     Neuro/Psych negative neurological ROS  negative psych ROS   GI/Hepatic negative GI ROS, Neg liver ROS,   Endo/Other  Obese BMI 36.9  Renal/GU negative Renal ROS  negative genitourinary   Musculoskeletal   Abdominal   Peds  Hematology negative hematology ROS (+)   Anesthesia Other Findings       Reproductive/Obstetrics (+) Pregnancy (s/p SVD on 3/20, PPBTL)                          Anesthesia Physical  Anesthesia Plan  ASA: III  Anesthesia Plan: Epidural   Post-op Pain Management:    Induction:   Airway Management Planned:   Additional Equipment:   Intra-op Plan:   Post-operative Plan:   Informed Consent: I have reviewed the patients History and Physical, chart, labs and discussed the procedure including the risks, benefits and alternatives for the proposed anesthesia with the patient or authorized representative who has indicated his/her understanding and acceptance.   Dental Advisory Given  Plan Discussed with: Surgeon and CRNA  Anesthesia Plan Comments: (Labs checked- platelets confirmed with RN in room. Fetal heart tracing, per RN, reported to be stable enough for sitting procedure. Discussed epidural, and patient consents to the procedure:  included risk of possible headache,backache, failed block, allergic reaction, and nerve injury. This patient was asked if she had any questions or concerns before the procedure started. )        Anesthesia Quick Evaluation

## 2012-05-18 NOTE — Progress Notes (Signed)
Post Partum Day 1  Subjective: up ad lib, voiding, tolerating PO, + flatus and having some residual lower pelvic pain, was able to tolerate PO yesterday but NPO since midnight for BTL this morning. She is bottle feeding.   Objective: Blood pressure 123/77, pulse 85, temperature 97.7 F (36.5 C), temperature source Oral, resp. rate 18, height 5\' 3"  (1.6 m), weight 94.348 kg (208 lb), last menstrual period 08/29/2011, SpO2 97.00%.  Physical Exam:  General: alert, cooperative and no distress Lochia: appropriate Uterine Fundus: firm Incision: none DVT Evaluation: No evidence of DVT seen on physical exam. Negative Homan's sign. No cords or calf tenderness. No significant calf/ankle edema.   Recent Labs  05/17/12 0835  HGB 12.1  HCT 35.3*    Assessment/Plan: 42 y/o female [id number] PPD1 Contraception BTL this morning    LOS: 1 day   Dorrene German 05/18/2012, 7:26 AM   I have seen and examined this patient and agree the above assessment. CRESENZO-DISHMAN,Manon [place] 05/18/2012 7:43 AM

## 2012-05-18 NOTE — Anesthesia Postprocedure Evaluation (Deleted)
  Anesthesia Post-op Note  Patient: Deborah Arnold  Procedure(s) Performed: Procedure(s): POST PARTUM TUBAL LIGATION (Bilateral)  Patient Location: PACU and Mother/Baby  Anesthesia Type:Epidural  Level of Consciousness: awake, alert , oriented and patient cooperative  Airway and Oxygen Therapy: Patient Spontanous Breathing  Post-op Pain: none  Post-op Assessment: Post-op Vital signs reviewed and Patient's Cardiovascular Status Stable  Post-op Vital Signs: Reviewed and stable  Complications: No apparent anesthesia complications

## 2012-05-18 NOTE — Anesthesia Postprocedure Evaluation (Signed)
  Anesthesia Post-op Note  Patient: Deborah Arnold  Procedure(s) Performed: * No procedures listed *  Patient Location: PACU and Mother/Baby  Anesthesia Type:Epidural  Level of Consciousness: awake, alert , oriented and patient cooperative  Airway and Oxygen Therapy: Patient Spontanous Breathing  Post-op Pain: none  Post-op Assessment: Post-op Vital signs reviewed and Patient's Cardiovascular Status Stable  Post-op Vital Signs: Reviewed and stable  Complications: No apparent anesthesia complications
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
## 2012-05-18 NOTE — Anesthesia Postprocedure Evaluation (Signed)
  Anesthesia Post-op Note  Anesthesia Post Note  Patient: Deborah Arnold  Procedure(s) Performed: Procedure(s) (LRB): POST PARTUM TUBAL LIGATION (Bilateral)  Anesthesia type: Epidural  Patient location: PACU  Post pain: Pain level controlled  Post assessment: Post-op Vital signs reviewed  Last Vitals:  Filed Vitals:   05/18/12 1330  BP: 95/66  Pulse: 72  Temp: 36.4 C  Resp: 16    Post vital signs: stable  Level of consciousness: awake  Complications: No apparent anesthesia complications

## 2012-05-18 NOTE — Op Note (Signed)
Deborah Arnold  PROCEDURE DATE:  05/18/2012  PREOPERATIVE DIAGNOSIS:  Multiparity, undesired fertility  POSTOPERATIVE DIAGNOSIS:  Multiparity, undesired fertility  PROCEDURE:  Post-partum bilateral tubal sterilization with Filshie Clips  SURGEON: Scheryl Darter, MD  ASSISTANT:  Napoleon Form, MD  ANESTHESIA:  Epidural and local analgesia using 0.5% Marcaine  COMPLICATIONS:  None immediate.  ESTIMATED BLOOD LOSS: 5 ml.  FLUIDS: 2000 ml LR.  URINE OUTPUT:  200 ml of clear urine.  INDICATIONS: 42 y.o. [id number] on postpartum day #1 after SVD  with undesired fertility, desires permanent sterilization.  Other reversible forms of contraception were discussed with patient; she declines all other modalities. Risks of procedure discussed with patient including but not limited to: risk of regret, permanence of method, bleeding, infection, injury to surrounding organs and need for additional procedures.  Failure risk of 0.5-1% with increased risk of ectopic gestation if pregnancy occurs was also discussed with patient.     FINDINGS:  Normal uterus, tubes, and ovaries.  PROCEDURE DETAILS: The patient was taken to the operating room where her epidural anesthesia was dosed up to surgical level and found to be adequate.  She was then placed in the dorsal supine position and prepped and draped in sterile fashion.  A foley catheter was placed in her bladder and placed to gravity. After an adequate timeout was performed, attention was turned to the patient's abdomen where a small transverse skin incision was made with a scalpel under the umbilical fold. The incision was taken down to the layer of fascia using Mayo scissors and fascia was incised, and extended bilaterally using Mayo scissors. The peritoneum was entered in a sharp fashion. Attention was then turned to the patient's uterus, and right fallopian tube was identified, grasped with Babcock clamps and followed out to the fimbriated end.  A Filshie clip  was placed on the right fallopian tube about 3 cm from the cornual attachment, with care given to incorporate the underlying mesosalpinx.  A similar process was carried out on the left side allowing for bilateral tubal sterilization.  Good hemostasis was noted overall.  Local analgesia was drizzled over both Filshie application sites.The instruments were then removed from the patient's abdomen and the fascial incision was repaired with 0 Vicryl, and the skin was closed with a 4-0 Vicryl subcuticular stitch. 8 cc of 0.5% Marcaine was injected subcutaneously around the incision prior to skin closure. The patient tolerated the procedure well.  Instrument, sponge, and needle counts were correct times two.  The patient was then taken to the recovery room awake and in stable condition.  Napoleon Form, MD 05/18/2012 11:52 AM

## 2012-05-18 NOTE — Progress Notes (Signed)
UR chart review completed.  

## 2012-05-18 NOTE — Progress Notes (Signed)
Patient ID: Deborah Arnold, female   DOB: 08/06/1970, 42 y.o.   MRN: [id number] S/P SVD, scheduled for permanent sterilization today. The procedure and the risks of anesthesia, bleeding, infection, visceral organ damage failure or ectopic pregnancy 1:200 were discussed and her questions were answered. Consent signed.  Deborah Arnold 05/18/2012 10:05 AM

## 2012-05-19 ENCOUNTER — Telehealth: Payer: Self-pay | Admitting: Family Medicine

## 2012-05-19 MED ORDER — DOCUSATE SODIUM 50 MG PO CAPS: 50.0000 mg | ORAL_CAPSULE | Freq: Three times a day (TID) | ORAL | Status: DC | PRN

## 2012-05-19 MED ORDER — OXYCODONE-ACETAMINOPHEN 5-325 MG PO TABS: 1.0000 | ORAL_TABLET | ORAL | Status: DC | PRN

## 2012-05-19 NOTE — Telephone Encounter (Signed)
Pt called [facility]. Could not get rx for percocet or colace filled at [facility].  I called pharmacy and was told that they do not have the resident in their system and that [facility] cannot fill the prescriptions. There  Is no way to override this per the pharmacist despite the fact the the rx has the physician's name and DEA number printed.  I called the pt back and suggested she take the rx to [facility] or [facility]. Pt agreed to try elsewhere. Napoleon Form, MD

## 2012-05-19 NOTE — Op Note (Signed)
Agree with note ARNOLD,JAMES 05/19/2012

## 2012-05-19 NOTE — Discharge Summary (Signed)
Obstetric Discharge Summary Reason for Admission: onset of labor Prenatal Procedures: NST Intrapartum Procedures: spontaneous vaginal delivery Postpartum Procedures: none and P.P. tubal ligation Complications-Operative and Postpartum: none Hemoglobin  Date Value Range Status  05/17/2012 12.1  12.0 - 15.0 g/dL Final     HCT  Date Value Range Status  05/17/2012 35.3* 36.0 - 46.0 % Final    Physical Exam:  General: alert, cooperative and appears stated age Cardiac: RRR, no m/g/r Lungs: CLAB, no w/r/r Lochia: appropriate Uterine Fundus: firm Incision: healing well, no significant drainage, no dehiscence, no significant erythema DVT Evaluation: No evidence of DVT seen on physical exam. No cords or calf tenderness.  Discharge Diagnoses: Term Pregnancy-delivered  Discharge Information: Date: 05/19/2012 Activity: pelvic rest Diet: routine Medications: Colace and Percocet Condition: stable Instructions: refer to practice specific booklet Discharge to: home Follow-up Information   Follow up with [facility] In 5 days.   Contact information:   [address] [place] [address] [phone]      Newborn Data: Live born female  Birth Weight: 7 lb 7.6 oz (3391 g) APGAR: 9, 9  Home with mother. Bottlefeeding.  Gregor Hams 05/19/2012, 7:26 AM

## 2012-05-20 NOTE — Discharge Summary (Signed)
Attestation of Attending Supervision of Advanced Practitioner: Evaluation and management procedures were performed by the PA/NP/CNM/OB Fellow under my supervision/collaboration. Chart reviewed and agree with management and plan.  Tilda Burrow 05/20/2012 9:39 PM

## 2012-05-21 ENCOUNTER — Encounter (HOSPITAL_COMMUNITY): Payer: Self-pay | Admitting: Obstetrics & Gynecology

## 2012-05-21 ENCOUNTER — Encounter: Payer: Self-pay | Admitting: Obstetrics & Gynecology

## 2012-05-21 LAB — TYPE AND SCREEN
ABO/RH(D): AB NEG
DAT, IgG: NEGATIVE
Unit division: 0

## 2012-07-19 ENCOUNTER — Encounter: Payer: Self-pay | Admitting: *Deleted

## 2012-08-07 ENCOUNTER — Encounter: Payer: Self-pay | Admitting: *Deleted

## 2012-08-28 ENCOUNTER — Encounter (HOSPITAL_COMMUNITY): Payer: Self-pay

## 2012-08-28 ENCOUNTER — Inpatient Hospital Stay (HOSPITAL_COMMUNITY): Payer: Medicaid Other | Admitting: Anesthesiology

## 2012-08-28 ENCOUNTER — Emergency Department (HOSPITAL_COMMUNITY): Payer: Medicaid Other

## 2012-08-28 ENCOUNTER — Encounter (HOSPITAL_COMMUNITY)
Admission: EM | Disposition: A | Discharge status: Home / Self Care | Payer: Self-pay | Source: Home / Self Care | Attending: Orthopedic Surgery

## 2012-08-28 ENCOUNTER — Encounter (HOSPITAL_COMMUNITY): Payer: Self-pay | Admitting: Anesthesiology

## 2012-08-28 ENCOUNTER — Inpatient Hospital Stay (HOSPITAL_COMMUNITY)
Admission: EM | Admit: 2012-08-28 | Discharge: 2012-08-30 | DRG: 494 | Disposition: A | Discharge status: Home / Self Care | Payer: Medicaid Other | Attending: Orthopedic Surgery | Admitting: Orthopedic Surgery

## 2012-08-28 DIAGNOSIS — S82899A Other fracture of unspecified lower leg, initial encounter for closed fracture: Principal | ICD-10-CM | POA: Diagnosis present

## 2012-08-28 DIAGNOSIS — W108XXA Fall (on) (from) other stairs and steps, initial encounter: Secondary | ICD-10-CM | POA: Diagnosis present

## 2012-08-28 DIAGNOSIS — S82851A Displaced trimalleolar fracture of right lower leg, initial encounter for closed fracture: Secondary | ICD-10-CM

## 2012-08-28 DIAGNOSIS — Z6836 Body mass index (BMI) 36.0-36.9, adult: Secondary | ICD-10-CM

## 2012-08-28 DIAGNOSIS — F17200 Nicotine dependence, unspecified, uncomplicated: Secondary | ICD-10-CM | POA: Diagnosis present

## 2012-08-28 HISTORY — PX: ORIF ANKLE FRACTURE: SHX5408

## 2012-08-28 HISTORY — DX: Family history of other specified conditions: Z84.89

## 2012-08-28 HISTORY — PX: ORIF ANKLE FRACTURE: SUR919

## 2012-08-28 LAB — POCT I-STAT, CHEM 8
BUN: 8 mg/dL (ref 6–23)
Chloride: 110 mEq/L (ref 96–112)
Glucose, Bld: 94 mg/dL (ref 70–99)
HCT: 39 % (ref 36.0–46.0)
Potassium: 3.8 mEq/L (ref 3.5–5.1)

## 2012-08-28 LAB — MRSA PCR SCREENING: MRSA by PCR: NEGATIVE

## 2012-08-28 SURGERY — OPEN REDUCTION INTERNAL FIXATION (ORIF) ANKLE FRACTURE
Anesthesia: General | Site: Ankle | Laterality: Right | Wound class: Clean

## 2012-08-28 MED ORDER — OXYCODONE HCL 5 MG/5ML PO SOLN: 5.0000 mg | Freq: Once | ORAL | Status: AC | PRN

## 2012-08-28 MED ORDER — METOCLOPRAMIDE HCL 5 MG/ML IJ SOLN: 5.0000 mg | Freq: Three times a day (TID) | INTRAMUSCULAR | Status: DC | PRN

## 2012-08-28 MED ORDER — METOCLOPRAMIDE HCL 5 MG/ML IJ SOLN: 10.0000 mg | Freq: Once | INTRAMUSCULAR | Status: AC | PRN

## 2012-08-28 MED ORDER — OXYCODONE HCL 5 MG PO TABS: 5.0000 mg | ORAL_TABLET | Freq: Once | ORAL | Status: AC | PRN

## 2012-08-28 MED ORDER — HYDROMORPHONE HCL PF 1 MG/ML IJ SOLN
1.0000 mg | INTRAMUSCULAR | Status: DC | PRN
  Administered 2012-08-28 (×3): 1 mg via INTRAVENOUS
  Filled 2012-08-28 (×3): qty 1

## 2012-08-28 MED ORDER — HYDROMORPHONE HCL PF 1 MG/ML IJ SOLN
0.2500 mg | INTRAMUSCULAR | Status: DC | PRN
  Administered 2012-08-28 (×3): 0.5 mg via INTRAVENOUS

## 2012-08-28 MED ORDER — ONDANSETRON HCL 4 MG/2ML IJ SOLN
INTRAMUSCULAR | Status: DC | PRN
  Administered 2012-08-28: 4 mg via INTRAVENOUS

## 2012-08-28 MED ORDER — METOCLOPRAMIDE HCL 10 MG PO TABS
5.0000 mg | ORAL_TABLET | Freq: Three times a day (TID) | ORAL | Status: DC | PRN
Start: 2012-08-28 — End: 2012-08-30

## 2012-08-28 MED ORDER — METHOCARBAMOL 100 MG/ML IJ SOLN
500.0000 mg | Freq: Four times a day (QID) | INTRAVENOUS | Status: DC | PRN
  Filled 2012-08-28: qty 5

## 2012-08-28 MED ORDER — HYDROCODONE-ACETAMINOPHEN 5-325 MG PO TABS
1.0000 | ORAL_TABLET | ORAL | Status: DC | PRN
  Administered 2012-08-29 – 2012-08-30 (×5): 2 via ORAL
  Filled 2012-08-28 (×5): qty 2

## 2012-08-28 MED ORDER — HYDROMORPHONE HCL PF 1 MG/ML IJ SOLN
INTRAMUSCULAR | Status: AC
  Filled 2012-08-28: qty 1

## 2012-08-28 MED ORDER — ONDANSETRON HCL 4 MG/2ML IJ SOLN
4.0000 mg | Freq: Four times a day (QID) | INTRAMUSCULAR | Status: DC | PRN
  Administered 2012-08-29: 4 mg via INTRAVENOUS
  Filled 2012-08-28: qty 2

## 2012-08-28 MED ORDER — PROPOFOL 10 MG/ML IV BOLUS
INTRAVENOUS | Status: DC | PRN
  Administered 2012-08-28: 150 mg via INTRAVENOUS

## 2012-08-28 MED ORDER — ONDANSETRON HCL 4 MG/2ML IJ SOLN
4.0000 mg | Freq: Three times a day (TID) | INTRAMUSCULAR | Status: DC | PRN
  Administered 2012-08-28: 4 mg via INTRAVENOUS
  Filled 2012-08-28: qty 2

## 2012-08-28 MED ORDER — FENTANYL CITRATE 0.05 MG/ML IJ SOLN
INTRAMUSCULAR | Status: DC | PRN
  Administered 2012-08-28: 100 ug via INTRAVENOUS
  Administered 2012-08-28 (×2): 25 ug via INTRAVENOUS
  Administered 2012-08-28: 50 ug via INTRAVENOUS
  Administered 2012-08-28 (×2): 25 ug via INTRAVENOUS

## 2012-08-28 MED ORDER — SODIUM CHLORIDE 0.9 % IV SOLN
INTRAVENOUS | Status: DC
  Administered 2012-08-28: 125 mL/h via INTRAVENOUS

## 2012-08-28 MED ORDER — PROPOFOL 10 MG/ML IV BOLUS
INTRAVENOUS | Status: AC | PRN
  Administered 2012-08-28: 30 mg via INTRAVENOUS
  Administered 2012-08-28: 10 mg via INTRAVENOUS
  Administered 2012-08-28: 20 mg via INTRAVENOUS

## 2012-08-28 MED ORDER — FENTANYL CITRATE 0.05 MG/ML IJ SOLN
50.0000 ug | INTRAMUSCULAR | Status: AC | PRN
  Administered 2012-08-28 (×2): 50 ug via INTRAVENOUS
  Filled 2012-08-28: qty 2

## 2012-08-28 MED ORDER — PROPOFOL 10 MG/ML IV BOLUS
0.5000 mg/kg | Freq: Once | INTRAVENOUS | Status: DC
  Filled 2012-08-28: qty 20

## 2012-08-28 MED ORDER — LIDOCAINE HCL (CARDIAC) 20 MG/ML IV SOLN
INTRAVENOUS | Status: DC | PRN
  Administered 2012-08-28: 50 mg via INTRAVENOUS

## 2012-08-28 MED ORDER — CEFAZOLIN SODIUM-DEXTROSE 2-3 GM-% IV SOLR
INTRAVENOUS | Status: AC
  Administered 2012-08-28: 2 g via INTRAVENOUS
  Filled 2012-08-28: qty 50

## 2012-08-28 MED ORDER — ONDANSETRON HCL 4 MG PO TABS: 4.0000 mg | ORAL_TABLET | Freq: Four times a day (QID) | ORAL | Status: DC | PRN

## 2012-08-28 MED ORDER — METHOCARBAMOL 500 MG PO TABS
500.0000 mg | ORAL_TABLET | Freq: Four times a day (QID) | ORAL | Status: DC | PRN
  Administered 2012-08-28 – 2012-08-30 (×4): 500 mg via ORAL
  Filled 2012-08-28 (×3): qty 1

## 2012-08-28 MED ORDER — HYDROMORPHONE HCL PF 1 MG/ML IJ SOLN
0.5000 mg | INTRAMUSCULAR | Status: DC | PRN
  Administered 2012-08-29: 1 mg via INTRAVENOUS
  Filled 2012-08-28 (×2): qty 1

## 2012-08-28 MED ORDER — OXYCODONE-ACETAMINOPHEN 5-325 MG PO TABS
1.0000 | ORAL_TABLET | ORAL | Status: DC | PRN
  Administered 2012-08-28 – 2012-08-29 (×3): 2 via ORAL
  Filled 2012-08-28 (×4): qty 2

## 2012-08-28 MED ORDER — SODIUM CHLORIDE 0.9 % IV SOLN: INTRAVENOUS | Status: DC

## 2012-08-28 MED ORDER — CEFAZOLIN SODIUM-DEXTROSE 2-3 GM-% IV SOLR
2.0000 g | Freq: Four times a day (QID) | INTRAVENOUS | Status: AC
  Administered 2012-08-28 – 2012-08-29 (×3): 2 g via INTRAVENOUS
  Filled 2012-08-28 (×3): qty 50

## 2012-08-28 MED ORDER — MIDAZOLAM HCL 5 MG/5ML IJ SOLN
INTRAMUSCULAR | Status: DC | PRN
  Administered 2012-08-28 (×2): 1 mg via INTRAVENOUS

## 2012-08-28 MED ORDER — ASPIRIN EC 325 MG PO TBEC
325.0000 mg | DELAYED_RELEASE_TABLET | Freq: Every day | ORAL | Status: DC
  Administered 2012-08-28 – 2012-08-30 (×3): 325 mg via ORAL
  Filled 2012-08-28 (×3): qty 1

## 2012-08-28 MED ORDER — LACTATED RINGERS IV SOLN
INTRAVENOUS | Status: DC | PRN
  Administered 2012-08-28 (×2): via INTRAVENOUS

## 2012-08-28 MED ORDER — METHOCARBAMOL 500 MG PO TABS
ORAL_TABLET | ORAL | Status: AC
  Filled 2012-08-28: qty 1

## 2012-08-28 MED ORDER — ARTIFICIAL TEARS OP OINT
TOPICAL_OINTMENT | OPHTHALMIC | Status: DC | PRN
  Administered 2012-08-28: 1 via OPHTHALMIC

## 2012-08-28 SURGICAL SUPPLY — 53 items
BANDAGE ESMARK 6X9 LF (GAUZE/BANDAGES/DRESSINGS) IMPLANT
BIT DRILL 2.5X2.75 QC CALB (BIT) ×1 IMPLANT
BIT DRILL 2.9 CANN QC NONSTRL (BIT) ×1 IMPLANT
BNDG ADH 5X4 AIR PERM ELC (GAUZE/BANDAGES/DRESSINGS) ×1
BNDG CMPR 9X6 STRL LF SNTH (GAUZE/BANDAGES/DRESSINGS)
BNDG COHESIVE 4X5 TAN STRL (GAUZE/BANDAGES/DRESSINGS) ×2 IMPLANT
BNDG COHESIVE 4X5 WHT NS (GAUZE/BANDAGES/DRESSINGS) ×1 IMPLANT
BNDG ESMARK 6X9 LF (GAUZE/BANDAGES/DRESSINGS)
BNDG GAUZE STRTCH 6 (GAUZE/BANDAGES/DRESSINGS) ×2 IMPLANT
CLOTH BEACON ORANGE TIMEOUT ST (SAFETY) ×2 IMPLANT
COVER SURGICAL LIGHT HANDLE (MISCELLANEOUS) ×2 IMPLANT
CUFF TOURNIQUET SINGLE 34IN LL (TOURNIQUET CUFF) IMPLANT
CUFF TOURNIQUET SINGLE 44IN (TOURNIQUET CUFF) IMPLANT
DRAPE C-ARM MINI 42X72 WSTRAPS (DRAPES) ×1 IMPLANT
DRAPE INCISE IOBAN 66X45 STRL (DRAPES) ×2 IMPLANT
DRAPE PROXIMA HALF (DRAPES) ×2 IMPLANT
DRAPE U-SHAPE 47X51 STRL (DRAPES) ×2 IMPLANT
DRSG ADAPTIC 3X8 NADH LF (GAUZE/BANDAGES/DRESSINGS) ×2 IMPLANT
DRSG EMULSION OIL 3X3 NADH (GAUZE/BANDAGES/DRESSINGS) ×1 IMPLANT
DURAPREP 26ML APPLICATOR (WOUND CARE) ×2 IMPLANT
ELECT REM PT RETURN 9FT ADLT (ELECTROSURGICAL) ×2
ELECTRODE REM PT RTRN 9FT ADLT (ELECTROSURGICAL) ×1 IMPLANT
GLOVE BIOGEL PI IND STRL 9 (GLOVE) ×1 IMPLANT
GLOVE BIOGEL PI INDICATOR 9 (GLOVE) ×1
GLOVE SURG ORTHO 9.0 STRL STRW (GLOVE) ×2 IMPLANT
GOWN PREVENTION PLUS XLARGE (GOWN DISPOSABLE) ×2 IMPLANT
GOWN SRG XL XLNG 56XLVL 4 (GOWN DISPOSABLE) ×2 IMPLANT
GOWN STRL NON-REIN XL XLG LVL4 (GOWN DISPOSABLE) ×4
K-WIRE ACE 1.6X6 (WIRE) ×4
KIT BASIN OR (CUSTOM PROCEDURE TRAY) ×2 IMPLANT
KIT ROOM TURNOVER OR (KITS) ×2 IMPLANT
KWIRE ACE 1.6X6 (WIRE) IMPLANT
MANIFOLD NEPTUNE II (INSTRUMENTS) ×2 IMPLANT
NS IRRIG 1000ML POUR BTL (IV SOLUTION) ×2 IMPLANT
PACK ORTHO EXTREMITY (CUSTOM PROCEDURE TRAY) ×2 IMPLANT
PAD ARMBOARD 7.5X6 YLW CONV (MISCELLANEOUS) ×4 IMPLANT
PADDING CAST COTTON 6X4 STRL (CAST SUPPLIES) ×2 IMPLANT
PLATE LOCK 7H 92 BILAT FIB (Plate) ×1 IMPLANT
SCREW ACE CAN 4.0 40M (Screw) ×2 IMPLANT
SCREW LOCK CORT STAR 3.5X10 (Screw) ×1 IMPLANT
SCREW LOCK CORT STAR 3.5X12 (Screw) ×3 IMPLANT
SCREW LOW PROFILE 12MMX3.5MM (Screw) ×1 IMPLANT
SCREW NON LOCKING LP 3.5 14MM (Screw) ×2 IMPLANT
SPONGE GAUZE 4X4 12PLY (GAUZE/BANDAGES/DRESSINGS) ×2 IMPLANT
SPONGE LAP 18X18 X RAY DECT (DISPOSABLE) ×2 IMPLANT
STAPLER VISISTAT 35W (STAPLE) IMPLANT
SUCTION FRAZIER TIP 10 FR DISP (SUCTIONS) ×2 IMPLANT
SUT ETHILON 2 0 PSLX (SUTURE) IMPLANT
SUT VIC AB 2-0 CTB1 (SUTURE) ×4 IMPLANT
TOWEL OR 17X24 6PK STRL BLUE (TOWEL DISPOSABLE) ×2 IMPLANT
TOWEL OR 17X26 10 PK STRL BLUE (TOWEL DISPOSABLE) ×2 IMPLANT
TUBE CONNECTING 12X1/4 (SUCTIONS) ×2 IMPLANT
WATER STERILE IRR 1000ML POUR (IV SOLUTION) ×2 IMPLANT

## 2012-08-28 NOTE — Transfer of Care (Signed)
Immediate Anesthesia Transfer of Care Note  Patient: Deborah Arnold  Procedure(s) Performed: Procedure(s): OPEN REDUCTION INTERNAL FIXATION (ORIF) RIGHT TRIMALEOLAR  ANKLE FRACTURE (Right)  Patient Location: PACU  Anesthesia Type:General  Level of Consciousness: awake, alert  and oriented  Airway & Oxygen Therapy: Patient Spontanous Breathing and Patient connected to nasal cannula oxygen  Post-op Assessment: Report given to PACU RN and Post -op Vital signs reviewed and stable  Post vital signs: Reviewed and stable  Complications: No apparent anesthesia complications

## 2012-08-28 NOTE — Anesthesia Preprocedure Evaluation (Signed)
Anesthesia Evaluation  Patient identified by MRN, date of birth, ID band Patient awake    Reviewed: Allergy & Precautions, H&P , NPO status , Patient's Chart, lab work & pertinent test results, reviewed documented beta blocker date and time   Airway Mallampati: II TM Distance: >3 FB Neck ROM: full    Dental   Pulmonary neg pulmonary ROS,  breath sounds clear to auscultation        Cardiovascular negative cardio ROS  Rhythm:regular     Neuro/Psych negative neurological ROS  negative psych ROS   GI/Hepatic negative GI ROS, Neg liver ROS,   Endo/Other  negative endocrine ROSMorbid obesity  Renal/GU negative Renal ROS  negative genitourinary   Musculoskeletal   Abdominal   Peds  Hematology negative hematology ROS (+)   Anesthesia Other Findings See surgeon's H&P   Reproductive/Obstetrics negative OB ROS                           Anesthesia Physical Anesthesia Plan  ASA: II  Anesthesia Plan: General   Post-op Pain Management:    Induction: Intravenous  Airway Management Planned: Oral ETT  Additional Equipment:   Intra-op Plan:   Post-operative Plan: Extubation in OR  Informed Consent: I have reviewed the patients History and Physical, chart, labs and discussed the procedure including the risks, benefits and alternatives for the proposed anesthesia with the patient or authorized representative who has indicated his/her understanding and acceptance.   Dental Advisory Given  Plan Discussed with: CRNA and Surgeon  Anesthesia Plan Comments:         Anesthesia Quick Evaluation

## 2012-08-28 NOTE — Preoperative (Signed)
Beta Blockers   Reason not to administer Beta Blockers:Not Applicable 

## 2012-08-28 NOTE — Progress Notes (Signed)
Pt picked up for transport to OR at 1656.

## 2012-08-28 NOTE — ED Provider Notes (Signed)
History    CSN: [id number] Arrival date & time 08/28/12  0120  First MD Initiated Contact with Patient 08/28/12 0130     Chief Complaint  Patient presents with  . Ankle Pain   HPI Pt was seen at 0215.  Per pt, c/o sudden onset and persistence of constant right ankle "pain" that began PTA. Pt states she was walking down the steps and "then stepped in a hole." Pt states she immediately felt pain in her right ankle. Denies any other injuries. Denies LOC, no AMS, no visual changes, focal motor weakness, no tingling/numbness in extremities, no prodromal symptoms before fall, no syncope, no neck or back pain.   Past Medical History  Diagnosis Date  . Preterm labor   . Abortion     x2  . Urinary tract infection    Past Surgical History  Procedure Laterality Date  . Knee cartilage surgery  1/08  . Induced abortion      x2  . Tubal ligation Bilateral 05/18/2012    Procedure: POST PARTUM TUBAL LIGATION;  Surgeon: Adam Phenix, MD;  Location: [facility];  Service: Gynecology;  Laterality: Bilateral;   Family History  Problem Relation Age of Onset  . Cancer Other     Breast-maternal side   History  Substance Use Topics  . Smoking status: Current Some Day Smoker -- 0.50 packs/day for 26 years    Types: Cigarettes  . Smokeless tobacco: Never Used  . Alcohol Use: 0.6 oz/week    1 Cans of beer per week     Comment: "1 beer a week for constipation" per patient-in early pregnancy   OB History   Grav Para Term Preterm Abortions TAB SAB Ect Mult Living   8 5 4 1 3 2 1   5      Review of Systems ROS: Statement: All systems negative except as marked or noted in the HPI; Constitutional: Negative for fever and chills. ; ; Eyes: Negative for eye pain, redness and discharge. ; ; ENMT: Negative for ear pain, hoarseness, nasal congestion, sinus pressure and sore throat. ; ; Cardiovascular: Negative for chest pain, palpitations, diaphoresis, dyspnea and peripheral edema. ; ; Respiratory: Negative for  cough, wheezing and stridor. ; ; Gastrointestinal: Negative for nausea, vomiting, diarrhea, abdominal pain, blood in stool, hematemesis, jaundice and rectal bleeding. . ; ; Genitourinary: Negative for dysuria, flank pain and hematuria. ; ; Musculoskeletal: Negative for back pain and neck pain. +right ankle pain, swelling and trauma.; ; Skin: +abrasion. Negative for pruritus, rash, blisters, bruising and skin lesion.; ; Neuro: Negative for headache, lightheadedness and neck stiffness. Negative for weakness, altered level of consciousness , altered mental status, extremity weakness, paresthesias, involuntary movement, seizure and syncope.      Allergies  Review of patient's allergies indicates no known allergies.  Home Medications   Current Outpatient Rx  Name  Route  Sig  Dispense  Refill  . acetaminophen (TYLENOL) 500 MG tablet   Oral   Take 500 mg by mouth as needed for pain.          BP 128/71  Pulse 60  Temp(Src) 98.7 F (37.1 C) (Oral)  Resp 22  Wt 207 lb 3.7 oz (94 kg)  BMI 36.72 kg/m2  SpO2 100%  Physical Exam 0220: Physical examination:  Nursing notes reviewed; Vital signs and O2 SAT reviewed;  Constitutional: Well developed, Well nourished, Well hydrated, Tearful; Head:  Normocephalic, +very superficial right forehead abrasion; Eyes: EOMI, PERRL, No scleral icterus; ENMT:  Mouth and pharynx normal, Mucous membranes moist; Neck: Supple, Full range of motion, No lymphadenopathy; Cardiovascular: Regular rate and rhythm, No murmur, rub, or gallop; Respiratory: Breath sounds clear & equal bilaterally, No rales, rhonchi, wheezes.  Speaking full sentences with ease, Normal respiratory effort/excursion; Chest: Nontender, Movement normal; Abdomen: Soft, Nontender, Nondistended, Normal bowel sounds; Genitourinary: No CVA tenderness; Extremities: Pulses normal, +right ankle tenderness and decreased ROM with obvious deformity, +strong pedal pulse, no open wounds. NMS intact right foot. NT  right knee/foot/toes. No edema, No calf edema or asymmetry.; Neuro: AA&Ox3, Major CN grossly intact.  Speech clear. No gross focal motor or sensory deficits in extremities.; Skin: Color normal, Warm, Dry.   ED Course  Procedures   0400: Procedural sedation:  Presedation checklist: Informed consent, including risks and benefits, obtained. Pre-procedural timeout performed with Pre-sedation confirmation of laterality/correct procedure site.  Provider confirms review of the nurses' note, allergies, medications, pertinent labs and xray, PMH, pre-induction vital signs, pulse oximetry, pain level, and patient condition satisfactory for commencing with order for sedation/procedure.  Awake and alert, Patient on monitor, Continuous pulse oximetry, Supplemental oxygen provided, NPO status verified, Suction available, Sedation reversal agent(s) at bedside as applicable; Resuscitative equipment readily available. Last meal: 8pm last night (8 hours), last fluids: 11pm last night (5 hours);  Physician time: 15 minutes; ASA Classification: 1. Normal healthy patient.;  Agent: Fentanyl, Propofol; Level: Moderate;  Complications: None; Treatment: None;  Reassessment: Awake, Alert, Oriented, Vital signs normal, Returned to presedation baseline, Procedure completed successfully, No complaints. Patient tolerated procedure and procedural sedation component as expected without apparent immediate complications.  Physician confirms procedural medication orders as administered, patient was assessed by physician post-procedure, and confirms post-sedation plan of care and disposition (admission).  Orthopedic Procedures: Fracture-dislocation reduction and splint application:  Timeout: Pre-procedural timeout; Indication: Fracture-dislocation; Joint: right ankle; Description: Closed; Procedure: Informed consent obtained, Procedural sedation (see note above),  Sterile technique used,  Closed reduction of fracture dislocation,  By traction; Immobilization with splint: By myself, Assisted by orthopedic technician, Short posterior leg splint with "U" stirrup splint; Post-reduction xray obtained, Reassessment: Pain improved, Neurovascularly intact, Swelling improved, Near-anatomic alignment restored.     MDM  MDM Reviewed: previous chart, nursing note and vitals Reviewed previous: labs Interpretation: labs and x-ray   Results for orders placed during the hospital encounter of 08/28/12  HCG, SERUM, QUALITATIVE      Result Value Range   Preg, Serum NEGATIVE  NEGATIVE  POCT I-STAT, CHEM 8      Result Value Range   Sodium 143  135 - 145 mEq/L   Potassium 3.8  3.5 - 5.1 mEq/L   Chloride 110  96 - 112 mEq/L   BUN 8  6 - 23 mg/dL   Creatinine, Ser 1.61  0.50 - 1.10 mg/dL   Glucose, Bld 94  70 - 99 mg/dL   Calcium, Ion 0.96  0.45 - 1.23 mmol/L   TCO2 19  0 - 100 mmol/L   Hemoglobin 13.3  12.0 - 15.0 g/dL   HCT 40.9  81.1 - 91.4 %   Dg Ankle Complete Right 08/28/2012   *RADIOLOGY REPORT*  Clinical Data: Right ankle pain.  RIGHT ANKLE - COMPLETE 3+ VIEW  Comparison: None.  Findings: Fracture dislocation of the right ankle is present. There is posterior subluxation of the talus on the tibial plafond. There appears to be a posterior malleolar fracture which remains dorsally located.  Oblique fracture of the distal fibular metaphysis extending into the lateral malleolus.  The mortise is disrupted.  Obvious deformity of the ankle with soft tissue swelling.  Medial malleolar fracture is displaced about 13 mm.  IMPRESSION:  Trimalleolar fracture dislocation of the right ankle.   Original Report Authenticated By: Andreas Newport, M.D.   Dg Ankle Right Port 08/28/2012   *RADIOLOGY REPORT*  Clinical Data: Post reduction ankle radiographs.  Ankle fracture.  PORTABLE RIGHT ANKLE - 2 VIEW  Comparison: None.  Findings: Reduction of the posterior ankle dislocation.  Majority of the posterior malleolus appears intact and the posterior  bone fragment appears to be related to the distal tibial fracture extending into the lateral malleolus.  Medial malleolar fragment remains anteriorly positioned relative to the tibial plafond. Talar dome appears intact.  Splint material is been applied. Widening of the ankle mortise.  IMPRESSION: Improved alignment with reduction of the posterior dislocation of the ankle.  Bimalleolar fracture noted.  Little if any posterior malleolar component is identified.   Original Report Authenticated By: Andreas Newport, M.D.     [phone]:  Right ankle fx-dislocation reduced and splinted under procedural sedation.  Pt tol well. Now A&O, resps easy, palp pulses right foot with splint intact. Dx and testing d/w pt.  Questions answered.  Verb understanding, agreeable to admit.  T/C to Ortho Dr. Lajoyce Corners, case discussed, including:  HPI, pertinent PM/SHx, VS/PE, dx testing, ED course and treatment:  Agreeable to admit, requests to write temporary orders, obtain medical bed on [address] floor, he may be able to take her to OR tomorrow afternoon.     Laray Anger, DO 08/29/12 2049

## 2012-08-28 NOTE — Op Note (Signed)
OPERATIVE REPORT  DATE OF SURGERY: 08/28/2012  PATIENT:  Deborah Arnold,  42 y.o. female  PRE-OPERATIVE DIAGNOSIS:  right ankle fracture trimalleolar  POST-OPERATIVE DIAGNOSIS:  same  PROCEDURE:  Procedure(s): OPEN REDUCTION INTERNAL FIXATION (ORIF) RIGHT TRIMALEOLAR  ANKLE FRACTURE  SURGEON:  Surgeon(s): Nadara Mustard, MD  ANESTHESIA:   general  EBL:  Minimal ML  SPECIMEN:  No Specimen  TOURNIQUET:  * No tourniquets in log *  PROCEDURE DETAILS: Patient is a 42 year old woman who fell sustaining a dislocated trimalleolar right ankle fracture. He was evaluated in emergency room reduced splinted and presents at this time for open reduction internal fixation. Risks and benefits were discussed including infection neurovascular injury pain DVT pulmonary embolus need for additional surgery. Patient states she understands and wished to proceed at this time. Description of procedure patient was brought to the operating room and underwent a general anesthetic. After adequate levels of anesthesia were obtained patient's right lower extremity was prepped first scrubbed with Betadine cleansed and then prepped using DuraPrep and draped with a sterile field. A lateral incision was made over the fibula this was carried down to the fracture site. The wound was irrigated cleansed there was comminuted bone that had no soft tissue attachment this was discarded. The fracture was reduced and locked with a 7 hole locking plate with 4 locking screws and 3 compression screws. C-arm fluoroscopy verified alignment of the fibula as well as verify the length of the fibula. The wound was irrigated with normal saline subcutaneous is closed using 0 Vicryl. Attention was then focused on the medial malleolus. The medial malleolar fragment was reduced after a extensile incision. This was irrigated this was then stabilized with 2 K wires and then 2 cannulated screws were inserted 40 mm in length. C-arm fluoroscopy verified a  congruent mortise. The wound is again irrigated subcutaneous is closed using 0 Vicryl and both wounds were closed using staples. Wounds were covered with Adaptic orthopedic sponges AB dressing Kerlix and Coban. Patient was extubated taken to the PACU in stable condition.  PLAN OF CARE: Admit to inpatient   PATIENT DISPOSITION:  PACU - hemodynamically stable.   Nadara Mustard, MD 08/28/2012 7:24 PM

## 2012-08-28 NOTE — ED Notes (Signed)
Patient ankle splinted by ortho.

## 2012-08-28 NOTE — ED Notes (Signed)
EDP at bedside  

## 2012-08-28 NOTE — ED Notes (Addendum)
Per EMS, took a wrong step off a porch step. Obviously right ankle deformity: pulses, sensation, and movement intact distally. 10mg  morphine given by EMS.

## 2012-08-28 NOTE — Progress Notes (Signed)
UR Chart Review Completed  

## 2012-08-28 NOTE — Progress Notes (Signed)
Partial brought to room in dental container

## 2012-08-28 NOTE — Anesthesia Procedure Notes (Signed)
Procedure Name: LMA Insertion Date/Time: 08/28/2012 6:31 PM Performed by: Gayla Medicus Pre-anesthesia Checklist: Patient identified, Timeout performed, Emergency Drugs available, Suction available and Patient being monitored Patient Re-evaluated:Patient Re-evaluated prior to inductionOxygen Delivery Method: Circle system utilized Preoxygenation: Pre-oxygenation with 100% oxygen Intubation Type: IV induction LMA: LMA inserted LMA Size: 4.0 Number of attempts: 1 Placement Confirmation: positive ETCO2 and breath sounds checked- equal and bilateral Tube secured with: Tape Dental Injury: Teeth and Oropharynx as per pre-operative assessment

## 2012-08-28 NOTE — ED Notes (Signed)
ETOH, reports 4 beers and a couple ounces of liquor

## 2012-08-28 NOTE — Progress Notes (Signed)
Orthopedic Tech Progress Note Patient Details:  Deborah Arnold [birth year] [id number]  Ortho Devices Type of Ortho Device: Postop shoe/boot Ortho Device/Splint Location: right foot Ortho Device/Splint Interventions: Application   [name] 08/28/2012, 10:05 PM

## 2012-08-28 NOTE — ED Notes (Signed)
Airway cart at bedside, suction/ambu bag at bedside, procedure consent signed.

## 2012-08-28 NOTE — Anesthesia Postprocedure Evaluation (Signed)
  Anesthesia Post-op Note  Patient: Deborah Arnold  Procedure(s) Performed: Procedure(s): OPEN REDUCTION INTERNAL FIXATION (ORIF) RIGHT TRIMALEOLAR  ANKLE FRACTURE (Right)  Patient Location: PACU  Anesthesia Type:General  Level of Consciousness: Sleeping, arousable  Airway and Oxygen Therapy: Patient Spontanous Breathing and Patient connected to nasal cannula oxygen  Post-op Pain: moderate  Post-op Assessment: Post-op Vital signs reviewed, Patient's Cardiovascular Status Stable, Respiratory Function Stable, Patent Airway and No signs of Nausea or vomiting  Post-op Vital Signs: Reviewed and stable  Complications: No apparent anesthesia complications

## 2012-08-28 NOTE — Progress Notes (Signed)
Orthopedic Tech Progress Note Patient Details:  Deborah Arnold 1970/11/18 [id number]  Ortho Devices Type of Ortho Device: Short leg splint   [name] 08/28/2012, 4:10 AM

## 2012-08-28 NOTE — Progress Notes (Signed)
Patient does not want partial put in mouth

## 2012-08-28 NOTE — H&P (Signed)
Deborah Arnold is an 42 y.o. female.   Chief Complaint: Trimalleolar dislocated right ankle fracture HPI: Deborah Arnold is a 42 year old woman who states that Deborah Arnold fell down some stairs sustaining the above-mentioned fracture. Deborah Arnold was seen in the emergency room underwent reduction by the ER physician was placed in a splint and is admitted for open reduction internal fixation of the ankle fracture.  Past Medical History  Diagnosis Date  . Preterm labor   . Abortion     x2  . Urinary tract infection     Past Surgical History  Procedure Laterality Date  . Knee cartilage surgery  1/08  . Induced abortion      x2  . Tubal ligation Bilateral 05/18/2012    Procedure: POST PARTUM TUBAL LIGATION;  Surgeon: Adam Phenix, MD;  Location: [facility];  Service: Gynecology;  Laterality: Bilateral;    Family History  Problem Relation Age of Onset  . Cancer Other     Breast-maternal side   Social History:  reports that Deborah Arnold has been smoking Cigarettes.  Deborah Arnold has a 13 pack-year smoking history. Deborah Arnold has never used smokeless tobacco. Deborah Arnold reports that Deborah Arnold drinks about 0.6 ounces of alcohol per week. Deborah Arnold reports that Deborah Arnold does not use illicit drugs.  Allergies: No Known Allergies  Medications Prior to Admission  Medication Sig Dispense Refill  . acetaminophen (TYLENOL) 500 MG tablet Take 500 mg by mouth as needed for pain.        Results for orders placed during the hospital encounter of 08/28/12 (from the past 48 hour(s))  HCG, SERUM, QUALITATIVE     Status: None   Collection Time    08/28/12  3:14 AM      Result Value Range   Preg, Serum NEGATIVE  NEGATIVE   Comment:            THE SENSITIVITY OF THIS     METHODOLOGY IS >10 mIU/mL.  POCT I-STAT, CHEM 8     Status: None   Collection Time    08/28/12  3:18 AM      Result Value Range   Sodium 143  135 - 145 mEq/L   Potassium 3.8  3.5 - 5.1 mEq/L   Chloride 110  96 - 112 mEq/L   BUN 8  6 - 23 mg/dL   Creatinine, Ser 8.29  0.50 - 1.10 mg/dL   Glucose, Bld 94  70 - 99 mg/dL   Calcium, Ion 5.62  1.30 - 1.23 mmol/L   TCO2 19  0 - 100 mmol/L   Hemoglobin 13.3  12.0 - 15.0 g/dL   HCT 86.5  78.4 - 69.6 %   Dg Ankle Complete Right  08/28/2012   *RADIOLOGY REPORT*  Clinical Data: Right ankle pain.  RIGHT ANKLE - COMPLETE 3+ VIEW  Comparison: None.  Findings: Fracture dislocation of the right ankle is present. There is posterior subluxation of the talus on the tibial plafond. There appears to be a posterior malleolar fracture which remains dorsally located.  Oblique fracture of the distal fibular metaphysis extending into the lateral malleolus.  The mortise is disrupted.  Obvious deformity of the ankle with soft tissue swelling.  Medial malleolar fracture is displaced about 13 mm.  IMPRESSION:  Trimalleolar fracture dislocation of the right ankle.   Original Report Authenticated By: Andreas Newport, M.D.   Dg Ankle Right Port  08/28/2012   *RADIOLOGY REPORT*  Clinical Data: Post reduction ankle radiographs.  Ankle fracture.  PORTABLE RIGHT ANKLE - 2 VIEW  Comparison: None.  Findings: Reduction of the posterior ankle dislocation.  Majority of the posterior malleolus appears intact and the posterior bone fragment appears to be related to the distal tibial fracture extending into the lateral malleolus.  Medial malleolar fragment remains anteriorly positioned relative to the tibial plafond. Talar dome appears intact.  Splint material is been applied. Widening of the ankle mortise.  IMPRESSION: Improved alignment with reduction of the posterior dislocation of the ankle.  Bimalleolar fracture noted.  Little if any posterior malleolar component is identified.   Original Report Authenticated By: Andreas Newport, M.D.    Review of Systems  All other systems reviewed and are negative.    Blood pressure 137/70, pulse 76, temperature 98.4 F (36.9 C), temperature source Oral, resp. rate 18, weight 94 kg (207 lb 3.7 oz), SpO2 100.00%. Physical Exam  On  examination the Ace wrap was extremely tight this was loosened. Deborah Arnold does have a palpable dorsalis pedis pulse. Review of the radiographs shows a displaced trimalleolar right ankle fracture.  Assessment/Plan Assessment: Displaced trimalleolar right ankle fracture.  Plan: We will plan for open reduction internal fixation. Risks and benefits were discussed including infection neurovascular injury pain arthritis DVT need for additional surgery. Deborah Arnold states Deborah Arnold understands and wished to proceed at this time.  [name],[name] 08/28/2012, 7:10 AM

## 2012-08-29 ENCOUNTER — Encounter (HOSPITAL_COMMUNITY): Payer: Self-pay | Admitting: General Practice

## 2012-08-29 MED ORDER — OXYCODONE HCL ER 10 MG PO T12A
10.0000 mg | EXTENDED_RELEASE_TABLET | Freq: Two times a day (BID) | ORAL | Status: DC
  Administered 2012-08-29 – 2012-08-30 (×3): 10 mg via ORAL
  Filled 2012-08-29 (×3): qty 1

## 2012-08-29 NOTE — Progress Notes (Signed)
Patient ID: Deborah Arnold, female   DOB: February 04, 1971, 42 y.o.   MRN: [id number] Postoperative day 1 open reduction internal fixation right ankle trimalleolar fracture. Plan for physical therapy today minimize weightbearing right foot with fracture boot in place for ambulation. Discharged to home when safe with ambulation.

## 2012-08-29 NOTE — Progress Notes (Signed)
Patient complaining of pain 10/10 in right ankle unrelieved by PRN percocet and vicodin.  Patient refuses to take the IV dilaudid due to the desire to get off IV pain medication.  Patient requests oxycontin, which helped manage her pain after her last surgery.  Dr. Lajoyce Corners notified; new order received for oxycontin.  Will continue to monitor.

## 2012-08-29 NOTE — Evaluation (Signed)
Physical Therapy Evaluation Patient Details Name: Deborah Arnold MRN: [id number] DOB: [birth year] Today's Date: 08/29/2012 Time: 4098-1191 PT Time Calculation (min): 18 min  PT Assessment / Plan / Recommendation History of Present Illness  s/p R ORIF ankle   Clinical Impression  Patient is s/p R ORIF of ankle due to fall and fx resulting in functional limitations due to the deficits listed below (see PT Problem List). Pt limited today due to pain in R and L ankle.Pt hopes to D/C home but will need 24/7 (A) and husband works during the day. Discussed ST-SNF possibility if (A) from friends/family cannot be provided. Pt is going to discuss options with her husband.  Patient will benefit from skilled PT to increase their independence and safety with mobility to allow discharge to next venue.      PT Assessment  Patient needs continued PT services    Follow Up Recommendations  Home health PT;Supervision/Assistance - 24 hour;Other (comment) (may need SNF; pt hopes to have home health aide or (A) )    Does the patient have the potential to tolerate intense rehabilitation      Barriers to Discharge Inaccessible home environment;Decreased caregiver support pt has steps and husband will be working during day with no support and 3 childern to care for    Equipment Recommendations  Rolling walker with 5" wheels;3in1 (PT);Wheelchair (measurements PT);Wheelchair cushion (measurements PT)    Recommendations for Other Services OT consult   Frequency Min 5X/week    Precautions / Restrictions Precautions Precautions: Fall Precaution Comments: pt with recent fall  Required Braces or Orthoses: Other Brace/Splint Other Brace/Splint: post-op shoe  Restrictions Weight Bearing Restrictions: Yes RLE Weight Bearing: Non weight bearing   Pertinent Vitals/Pain 3/10; premedicated.       Mobility  Bed Mobility Bed Mobility: Supine to Sit Supine to Sit: 5: Supervision;HOB elevated;With rails Details  for Bed Mobility Assistance: pt able to advance R LE to EOB with UEs; verbal cues for sequencing and hand placement; requires increased time due to pain and hand rails Transfers Transfers: Sit to Stand;Stand to Sit;Stand Pivot Transfers Sit to Stand: 3: Mod assist;From bed;From elevated surface;With upper extremity assist Stand to Sit: 4: Min assist;To chair/3-in-1;With armrests;With upper extremity assist Stand Pivot Transfers: 3: Mod assist;From elevated surface;With armrests Details for Transfer Assistance: pt requires (A) to maintain balance and NWB status on R LE; pt pivoted to L LE with verbal cues for sequencing and safety; pt transferred bed to bedside commode to recliner   Ambulation/Gait Ambulation/Gait Assistance: 3: Mod assist Ambulation Distance (Feet): 2 Feet Assistive device: Rolling walker Ambulation/Gait Assistance Details: verbal cues for safety and to maintain NWB status on R LE; pt requires (A) to maintain balance and manage RW Gait Pattern:  (hop to) Gait velocity: decreased due to NWB status on R LE General Gait Details: unable to increase amb distance due to c/o pain in L ankle as well Stairs: No Wheelchair Mobility Wheelchair Mobility: No    Exercises     PT Diagnosis: Difficulty walking;Acute pain  PT Problem List: Decreased balance;Decreased mobility;Decreased knowledge of use of DME;Pain PT Treatment Interventions: DME instruction;Gait training;Stair training;Functional mobility training;Therapeutic activities;Therapeutic exercise;Balance training;Neuromuscular re-education;Patient/family education;Wheelchair mobility training     PT Goals(Current goals can be found in the care plan section) Acute Rehab PT Goals Patient Stated Goal: to not have pain  PT Goal Formulation: With patient Time For Goal Achievement: 09/05/12 Potential to Achieve Goals: Good  Visit Information  Last PT Received On: 08/29/12  Assistance Needed: +2 (for amb for safety ) History  of Present Illness: s/p R ORIF ankle        Prior Functioning  Home Living Family/patient expects to be discharged to:: Private residence Living Arrangements: Spouse/significant other Available Help at Discharge: Family;Available PRN/intermittently Type of Home: Mobile home Home Access: Stairs to enter Entrance Stairs-Number of Steps: 4-5 Entrance Stairs-Rails: Can reach both Home Layout: One level Home Equipment: None Additional Comments: has 3 younger kids at home and husband works during day Prior Function Level of Independence: Independent Communication Communication: No difficulties    Cognition  Cognition Arousal/Alertness: Awake/alert Behavior During Therapy: WFL for tasks assessed/performed Overall Cognitive Status: Within Functional Limits for tasks assessed    Extremity/Trunk Assessment Upper Extremity Assessment Upper Extremity Assessment: Defer to OT evaluation Lower Extremity Assessment Lower Extremity Assessment: RLE deficits/detail RLE: Unable to fully assess due to pain RLE Sensation:  (WFL to light touch above bracing ) Cervical / Trunk Assessment Cervical / Trunk Assessment: Normal   Balance Balance Balance Assessed: Yes Static Standing Balance Static Standing - Balance Support: Right upper extremity supported;During functional activity Static Standing - Level of Assistance: 4: Min assist  End of Session PT - End of Session Equipment Utilized During Treatment: Gait belt Activity Tolerance: Patient limited by pain Patient left: in chair;with call bell/phone within reach Nurse Communication: Mobility status;Other (comment) (pain in  L ankle as well )  GP     Donell Sievert, [place] [phone] 08/29/2012, 11:42 AM

## 2012-08-30 ENCOUNTER — Encounter (HOSPITAL_COMMUNITY): Payer: Self-pay | Admitting: Orthopedic Surgery

## 2012-08-30 MED ORDER — HYDROCODONE-ACETAMINOPHEN 5-325 MG PO TABS: 1.0000 | ORAL_TABLET | Freq: Four times a day (QID) | ORAL | Status: DC | PRN

## 2012-08-30 NOTE — Progress Notes (Signed)
Orthopedic Tech Progress Note Patient Details:  Deborah Arnold 02/01/1971 [id number]  Ortho Devices Type of Ortho Device: CAM walker Ortho Device/Splint Location: right foot Ortho Device/Splint Interventions: Application   Cammer, Masiel Gentzler Carol 08/30/2012, 11:37 AM  

## 2012-08-30 NOTE — Progress Notes (Signed)
Orthopedic Tech Progress Note Patient Details:  Deborah Arnold 25-Nov-1970 [id number]  Ortho Devices Type of Ortho Device: CAM walker Ortho Device/Splint Location: right foot Ortho Device/Splint Interventions: Application   Cammer, Mickie Bail 08/30/2012, 11:37 AM

## 2012-08-30 NOTE — Progress Notes (Signed)
Physical Therapy Treatment Patient Details Name: Deborah Arnold MRN: [id number] DOB: 1970-07-02 Today's Date: 08/30/2012 Time: 4098-1191 PT Time Calculation (min): 35 min  PT Assessment / Plan / Recommendation  PT Comments   Pt progressing well with PT. Pt unable to amb steps today due to pain. Pt and husband educated on multiple strategies for amb steps. Pt and husband verbalized understanding but stated, "we are just going to bump up". Pt has all DME needs for D/C.  Pt states she is going to have her grandmother check in on her and will have HHPT to (A) with transition home. Pt moving well enough from mobility standpoint. Discussed with pt importance of using AD and wheelchair to prevent future falls. Also dicussed and gave pt brochure of vestibular rehab that is available to help her research why she may be having recurrent falls. Pt very appreciative.   Follow Up Recommendations  Home health PT;Supervision/Assistance - 24 hour;Other (comment)     Does the patient have the potential to tolerate intense rehabilitation     Barriers to Discharge        Equipment Recommendations  Rolling walker with 5" wheels;3in1 (PT);Wheelchair (measurements PT);Wheelchair cushion (measurements PT)    Recommendations for Other Services    Frequency Min 5X/week   Progress towards PT Goals Progress towards PT goals: Progressing toward goals  Plan Current plan remains appropriate    Precautions / Restrictions Precautions Precautions: Fall Precaution Comments: pt with recent fall  Required Braces or Orthoses: Other Brace/Splint Other Brace/Splint: CAM walker  Restrictions Weight Bearing Restrictions: Yes RLE Weight Bearing: Non weight bearing   Pertinent Vitals/Pain Reports 10/10 pain when moving ankle     Mobility  Bed Mobility Bed Mobility: Not assessed (pt sitting in chair ) Transfers Transfers: Sit to Stand;Stand to Sit Sit to Stand: 5: Supervision;From chair/3-in-1;With armrests Stand to  Sit: 5: Supervision;With armrests;To chair/3-in-1 Details for Transfer Assistance: min cues required for hand placement and safety with RW; pt able to transfer with grab bar from lower toilet surface with stand by (A) for safety. Ambulation/Gait Ambulation/Gait Assistance: 5: Supervision Ambulation Distance (Feet): 30 Feet Assistive device: Rolling walker Ambulation/Gait Assistance Details: verbal cues to maintain NWB status with CAM boot; pt reports difficulty maintaining due to weight of CAM boot but is able to. Pt reports intermitent dizziness but no LOB noticed; limited in amb due to dizziness and fatigue  Gait Pattern:  (hop to due to NWB status on R LE ) Gait velocity: decreased due to NWB status on R LE Stairs: Yes Stairs Assistance: 3: Mod assist Stairs Assistance Details (indicate cue type and reason): attempted amb of step and after completing one step pt yelled out in pain and refused to continue; pt states she is going to bumb up on her bottom or have her husband bumb her up in her wheelchair  Stair Management Technique: One rail Left;Step to pattern;Forwards;Other (comment) (used husband for R UE support) Number of Stairs: 1 Wheelchair Mobility Wheelchair Mobility: No    Exercises     PT Diagnosis:    PT Problem List:   PT Treatment Interventions:     PT Goals (current goals can now be found in the care plan section) Acute Rehab PT Goals Patient Stated Goal: to not have pain  PT Goal Formulation: With patient Time For Goal Achievement: 09/05/12 Potential to Achieve Goals: Good  Visit Information  Last PT Received On: 08/30/12 Assistance Needed: +1 History of Present Illness: s/p R ORIF ankle  Subjective Data  Subjective: pt sitting in chair; agreeable to thearpy; "i just dont know if im going to be able to do steps but i will try after we go to the bathroom" Patient Stated Goal: to not have pain    Cognition  Cognition Arousal/Alertness: Awake/alert Behavior  During Therapy: WFL for tasks assessed/performed Overall Cognitive Status: Within Functional Limits for tasks assessed    Balance  Balance Balance Assessed: No  End of Session PT - End of Session Equipment Utilized During Treatment: Gait belt Activity Tolerance: Patient tolerated treatment well Patient left: in chair;with call bell/phone within reach;with family/visitor present Nurse Communication: Mobility status   GP     Donell Sievert, [place] [phone] 08/30/2012, 2:40 PM

## 2012-08-30 NOTE — Discharge Summary (Signed)
Physician Discharge Summary  Patient ID: Deborah Arnold MRN: [id number] DOB/AGE: 43-29-1972 43 y.o.  Admit date: 08/28/2012 Discharge date: 08/30/2012  Admission Diagnoses: Trimalleolar right ankle fracture  Discharge Diagnoses: Trimalleolar right ankle fracture Active Problems:   * No active hospital problems. *   Discharged Condition: stable  Hospital Course: Patient's hospital course was essentially unremarkable. She underwent open reduction internal fixation of a trimalleolar right ankle fracture. Postoperatively patient progressed well and was discharged to home in stable condition.  Consults: None  Significant Diagnostic Studies: labs: Routine labs  Treatments: surgery: See operative note  Discharge Exam: Blood pressure 123/67, pulse 84, temperature 98.4 F (36.9 C), temperature source Oral, resp. rate 16, weight 94 kg (207 lb 3.7 oz), SpO2 100.00%. Incision/Wound: dressing clean and dry at  Disposition: 01-Home or Self Care  Discharge Orders   Future Orders Complete By Expires     Ambulatory referral to Home Health  As directed     Comments:      Please evaluate [name] for admission to [facility].  Disciplines requested: Physical Therapy  Services to provide: Strengthening Exercises  Physician to follow patient's care (the person listed here will be responsible for signing ongoing orders): Referring Provider  Requested Start of Care Date: Within 2-3 days  Special Instructions:  Patient will need a walker for home use as well as other DME.    Apply cam walker  As directed     Questions:      Laterality:  Right    Call MD / Call 911  As directed     Comments:      If you experience chest pain or shortness of breath, CALL 911 and be transported to the hospital emergency room.  If you develope a fever above 101 F, pus (white drainage) or increased drainage or redness at the wound, or calf pain, call your surgeon's office.    Constipation Prevention  As  directed     Comments:      Drink plenty of fluids.  Prune juice may be helpful.  You may use a stool softener, such as Colace (over the counter) 100 mg twice a day.  Use MiraLax (over the counter) for constipation as needed.    Diet - low sodium heart healthy  As directed     Increase activity slowly as tolerated  As directed     Touch down weight bearing  As directed     Scheduling Instructions:      Minimize weightbearing right lower extremity.        Medication List         acetaminophen 500 MG tablet  Commonly known as:  TYLENOL  Take 500 mg by mouth as needed for pain.     HYDROcodone-acetaminophen 5-325 MG per tablet  Commonly known as:  NORCO  Take 1 tablet by mouth every 6 (six) hours as needed for pain.           Follow-up Information   Follow up with Alyzza Andringa V, MD In 2 weeks.   Contact information:   [address] [place] [address] [phone]       Signed: Nadara Mustard 08/30/2012, 6:40 AM

## 2012-08-30 NOTE — Care Management Note (Signed)
    Page 1 of 2   08/30/2012     2:33:24 PM   CARE MANAGEMENT NOTE 08/30/2012  Patient:  Deborah Arnold, Deborah Arnold   Account Number:  [id number]  Date Initiated:  08/30/2012  Documentation initiated by:  [facility]  Subjective/Objective Assessment:   admitted with rt ankle fracture     Action/Plan:   PT eval- recommendede HHPT   Anticipated DC Date:  08/30/2012   Anticipated DC Plan:  HOME W HOME HEALTH SERVICES         Choice offered to / List presented to:  C-1 Patient   DME arranged  3-N-1  WALKER - ROLLING  WHEELCHAIR - MANUAL  TUB BENCH      DME agency  [facility]     HH arranged  HH-2 PT  HH-4 NURSE'S AIDE      HH agency  [facility]   Status of service:  Completed, signed off Medicare Important Message given?   (If response is "NO", the following Medicare IM given date fields will be blank) Date Medicare IM given:   Date Additional Medicare IM given:    Discharge Disposition:  HOME W HOME HEALTH SERVICES  Per UR Regulation:    If discussed at Long Length of Stay Meetings, dates discussed:    Comments:  08/30/12 Spoke with patient about HHC, she chose [facility]. Contacted Tavon Magnussen with [facility] and set up HHPT and HHaide. [facility] delivered 3N1 and rolling walker to patient's room and they will deliver wheelchair, wheelchair cushion, and tub bench to patient's home. Patient stated that she does not have a phone, [facility] will see her on 09/01/12, patient aware. Jacquelynn Cree RN, BSN, CCM

## 2013-12-30 ENCOUNTER — Encounter (HOSPITAL_COMMUNITY): Payer: Self-pay | Admitting: Orthopedic Surgery

## 2015-01-19 ENCOUNTER — Inpatient Hospital Stay (HOSPITAL_COMMUNITY): Admission: AD | Admit: 2015-01-19 | Payer: Medicaid Other | Source: Intra-hospital | Admitting: Psychiatry

## 2015-01-19 ENCOUNTER — Encounter (HOSPITAL_COMMUNITY): Payer: Self-pay | Admitting: Emergency Medicine

## 2015-01-19 ENCOUNTER — Emergency Department (HOSPITAL_COMMUNITY): Payer: Medicaid Other

## 2015-01-19 ENCOUNTER — Emergency Department (HOSPITAL_COMMUNITY)
Admission: EM | Admit: 2015-01-19 | Discharge: 2015-01-19 | Disposition: A | Discharge status: Home / Self Care | Payer: Medicaid Other | Attending: Emergency Medicine | Admitting: Emergency Medicine

## 2015-01-19 ENCOUNTER — Observation Stay (HOSPITAL_COMMUNITY): Admission: AD | Admit: 2015-01-19 | Payer: Medicaid Other | Source: Intra-hospital | Admitting: Psychiatry

## 2015-01-19 DIAGNOSIS — Z3202 Encounter for pregnancy test, result negative: Secondary | ICD-10-CM | POA: Diagnosis not present

## 2015-01-19 DIAGNOSIS — R42 Dizziness and giddiness: Secondary | ICD-10-CM | POA: Diagnosis not present

## 2015-01-19 DIAGNOSIS — F419 Anxiety disorder, unspecified: Secondary | ICD-10-CM | POA: Diagnosis not present

## 2015-01-19 DIAGNOSIS — R200 Anesthesia of skin: Secondary | ICD-10-CM | POA: Diagnosis present

## 2015-01-19 DIAGNOSIS — F17210 Nicotine dependence, cigarettes, uncomplicated: Secondary | ICD-10-CM | POA: Insufficient documentation

## 2015-01-19 DIAGNOSIS — F329 Major depressive disorder, single episode, unspecified: Secondary | ICD-10-CM | POA: Insufficient documentation

## 2015-01-19 DIAGNOSIS — R202 Paresthesia of skin: Secondary | ICD-10-CM

## 2015-01-19 DIAGNOSIS — Z87440 Personal history of urinary (tract) infections: Secondary | ICD-10-CM | POA: Diagnosis not present

## 2015-01-19 DIAGNOSIS — F32A Depression, unspecified: Secondary | ICD-10-CM

## 2015-01-19 LAB — COMPREHENSIVE METABOLIC PANEL
ALBUMIN: 4.3 g/dL (ref 3.5–5.0)
ALK PHOS: 53 U/L (ref 38–126)
ALT: 21 U/L (ref 14–54)
AST: 21 U/L (ref 15–41)
Anion gap: 6 (ref 5–15)
BUN: 15 mg/dL (ref 6–20)
CALCIUM: 9.8 mg/dL (ref 8.9–10.3)
CHLORIDE: 108 mmol/L (ref 101–111)
CO2: 25 mmol/L (ref 22–32)
CREATININE: 0.68 mg/dL (ref 0.44–1.00)
GFR calc non Af Amer: >60 mL/min (ref >60)
GLUCOSE: 91 mg/dL (ref 65–99)
Potassium: 4.9 mmol/L (ref 3.5–5.1)
SODIUM: 139 mmol/L (ref 135–145)
Total Bilirubin: 0.3 mg/dL (ref 0.3–1.2)
Total Protein: 7.6 g/dL (ref 6.5–8.1)

## 2015-01-19 LAB — URINALYSIS, ROUTINE W REFLEX MICROSCOPIC
BILIRUBIN URINE: NEGATIVE
GLUCOSE, UA: NEGATIVE mg/dL
KETONES UR: NEGATIVE mg/dL
LEUKOCYTES UA: NEGATIVE
NITRITE: NEGATIVE
PH: 5 (ref 5.0–8.0)
Protein, ur: NEGATIVE mg/dL
SPECIFIC GRAVITY, URINE: 1.02 (ref 1.005–1.030)

## 2015-01-19 LAB — CBC
HCT: 43 % (ref 36.0–46.0)
HEMOGLOBIN: 14.7 g/dL (ref 12.0–15.0)
MCH: 30.7 pg (ref 26.0–34.0)
MCHC: 34.2 g/dL (ref 30.0–36.0)
MCV: 89.8 fL (ref 78.0–100.0)
PLATELETS: 223 10*3/uL (ref 150–400)
RBC: 4.79 MIL/uL (ref 3.87–5.11)
RDW: 14.2 % (ref 11.5–15.5)
WBC: 9.5 10*3/uL (ref 4.0–10.5)

## 2015-01-19 LAB — PREGNANCY, URINE: Preg Test, Ur: NEGATIVE

## 2015-01-19 LAB — ETHANOL

## 2015-01-19 LAB — URINE MICROSCOPIC-ADD ON

## 2015-01-19 MED ORDER — ACETAMINOPHEN 325 MG PO TABS: 650.0000 mg | ORAL_TABLET | ORAL | Status: DC | PRN

## 2015-01-19 MED ORDER — LORAZEPAM 1 MG PO TABS
1.0000 mg | ORAL_TABLET | Freq: Once | ORAL | Status: AC
  Administered 2015-01-19: 1 mg via ORAL
  Filled 2015-01-19: qty 1

## 2015-01-19 MED ORDER — LORAZEPAM 1 MG PO TABS: 1.0000 mg | ORAL_TABLET | Freq: Three times a day (TID) | ORAL | Status: DC | PRN

## 2015-01-19 MED ORDER — SODIUM CHLORIDE 0.9 % IV SOLN
INTRAVENOUS | Status: DC
  Administered 2015-01-19: 13:00:00 via INTRAVENOUS

## 2015-01-19 NOTE — ED Notes (Signed)
Called [facility] for transport to [facility]. 

## 2015-01-19 NOTE — ED Provider Notes (Addendum)
CSN: [id number]     Arrival date & time 01/19/15  [id number] History  By signing my name below, I, Jarvis Morganaylor Ferguson, attest that this documentation has been prepared under the direction and in the presence of Cathren LaineKevin Tatumn Corbridge, MD. Electronically Signed: Jarvis Morganaylor Ferguson, ED Scribe. 01/19/2015. 11:31 AM.    Chief Complaint  Patient presents with  . Numbness    right-sided for 3 weeks.     The history is provided by the patient and a relative. No language interpreter was used.    HPI Comments: Deborah Arnold is a 44 y.o. female who presents to the Emergency Department complaining of persistent right sided numbness for 2 weeks. States is mainly right face and right arm. She states the numbness radiates throughout the right side of her face and right arm. Pt also notes vague sense of dizziness, which is mild imbalance, mild room spinning sensation.  No tinnitus, hearing loss or ear pain. No uri c/o. No fever or chills. No headaches. Pt denies neck pain or pain w rom neck. No radicular pain. Marland Kitchen. Pt endorses that certain movements, and/or changes in position can exacerbate the dizziness. No near syncope or syncope. Patient does acknowledge increased stress, and at times feels depressed. Indicates this has been going on for years. No acute/recent stressor. Denies thoughts or harm to self or others.  Pt's family member has concern for bipolar or schizophrenia. Nurse's note reports that the pt has been evaulated due to denial of psychiatric symptoms. Pt reports left hand dominance. She denies any numbness or weakness in her lower extremities., chest pain, SOB or drooling.   Past Medical History  Diagnosis Date  . Preterm labor   . Abortion     x2  . Urinary tract infection   . Family history of anesthesia complication     " my son has malignant hypothermia "   Past Surgical History  Procedure Laterality Date  . Knee cartilage surgery  1/08  . Induced abortion      x2  . Tubal ligation Bilateral 05/18/2012     Procedure: POST PARTUM TUBAL LIGATION;  Surgeon: Adam PhenixJames G Arnold, MD;  Location: [facility];  Service: Gynecology;  Laterality: Bilateral;  . Orif ankle fracture Right 08/28/2012    Dr Lajoyce Cornersuda  . Orif ankle fracture Right 08/28/2012    Procedure: OPEN REDUCTION INTERNAL FIXATION (ORIF) RIGHT TRIMALEOLAR  ANKLE FRACTURE;  Surgeon: Nadara MustardMarcus V [name], MD;  Location: [facility];  Service: Orthopedics;  Laterality: Right;   Family History  Problem Relation Age of Onset  . Cancer Other     Breast-maternal side   Social History  Substance Use Topics  . Smoking status: Current Some Day Smoker -- 0.50 packs/day for 26 years    Types: Cigarettes  . Smokeless tobacco: Never Used  . Alcohol Use: 0.6 oz/week    1 Cans of beer per week     Comment: "1 beer a week for constipation" per patient-in early pregnancy   OB History    Gravida Para Term Preterm AB TAB SAB Ectopic Multiple Living   8 5 4 1 3 2 1   5      Review of Systems  Constitutional: Negative for fever and chills.  HENT: Negative for sore throat and trouble swallowing.   Eyes: Negative for visual disturbance.  Respiratory: Negative for shortness of breath.   Cardiovascular: Negative for chest pain.  Gastrointestinal: Negative for vomiting and abdominal pain.  Endocrine: Negative for polyuria.  Genitourinary: Negative for  flank pain.  Musculoskeletal: Negative for back pain and neck pain.  Skin: Negative for rash.  Neurological: Positive for numbness. Negative for speech difficulty and headaches.  Hematological: Does not bruise/bleed easily.  Psychiatric/Behavioral: Positive for dysphoric mood. Negative for suicidal ideas. The patient is nervous/anxious.   All other systems reviewed and are negative.     Allergies  Review of patient's allergies indicates no known allergies.  Home Medications   Prior to Admission medications   Medication Sig Start Date End Date Taking? Authorizing Provider  acetaminophen (TYLENOL) 500 MG tablet Take 500 mg  by mouth as needed for pain.   Yes Historical Provider, MD  ibuprofen (ADVIL,MOTRIN) 200 MG tablet Take 200 mg by mouth every 6 (six) hours as needed for moderate pain.   Yes Historical Provider, MD  HYDROcodone-acetaminophen (NORCO) 5-325 MG per tablet Take 1 tablet by mouth every 6 (six) hours as needed for pain. Patient not taking: Reported on 01/19/2015 08/30/12   Nadara Mustard, MD   Triage Vitals: BP 136/98 mmHg  Pulse 78  Temp(Src) 97.9 F (36.6 C) (Oral)  Resp 16  Ht 5\' 3"  (1.6 m)  Wt 177 lb (80.287 kg)  BMI 31.36 kg/m2  SpO2 100%  LMP 12/29/2014  Physical Exam  Constitutional: She is oriented to person, place, and time. She appears well-developed and well-nourished. No distress.  HENT:  Head: Normocephalic and atraumatic.  Nose: Nose normal.  Mouth/Throat: Oropharynx is clear and moist.  No sinus or temporal tenderness.  Eyes: Conjunctivae and EOM are normal. No scleral icterus.  Anisocoria, w left pupil is 1 mm larger than right (pt indicates is baseline for her)  Neck: Neck supple. No tracheal deviation present. No thyromegaly present.  No stiffness or rigidity. No bruits.   Cardiovascular: Normal rate, regular rhythm, normal heart sounds and intact distal pulses.  Exam reveals no gallop and no friction rub.   No murmur heard. Pulmonary/Chest: Effort normal and breath sounds normal. No respiratory distress.  Abdominal: Soft. Normal appearance and bowel sounds are normal. She exhibits no distension. There is no tenderness.  Genitourinary:  No cva tenderness.  Musculoskeletal: Normal range of motion. She exhibits no edema or tenderness.  Neurological: She is alert and oriented to person, place, and time. No cranial nerve deficit. Coordination normal.  Motor intact bilaterally. stre 5/5. sens grossly intact. Romberg unremarkable. No pronator drift. Pt states feels unsteady when up and testing gait, however gait observed to be steady gait.   Skin: Skin is warm and dry. No rash  noted. She is not diaphoretic.  Psychiatric: Her behavior is normal.  Mildly anxious.   Nursing note and vitals reviewed.   ED Course  Procedures (including critical care time)  DIAGNOSTIC STUDIES: Oxygen Saturation is 100% on RA, normal by my interpretation.    COORDINATION OF CARE:  12:03 PM- Will roder brain MRI and diagnostic lab work. Pt advised of plan for treatment and pt agrees.   Results for orders placed or performed during the hospital encounter of 01/19/15  CBC  Result Value Ref Range   WBC 9.5 4.0 - 10.5 K/uL   RBC 4.79 3.87 - 5.11 MIL/uL   Hemoglobin 14.7 12.0 - 15.0 g/dL   HCT 16.1 09.6 - 04.5 %   MCV 89.8 78.0 - 100.0 fL   MCH 30.7 26.0 - 34.0 pg   MCHC 34.2 30.0 - 36.0 g/dL   RDW 40.9 81.1 - 91.4 %   Platelets 223 150 - 400 K/uL  Comprehensive  metabolic panel  Result Value Ref Range   Sodium 139 135 - 145 mmol/L   Potassium 4.9 3.5 - 5.1 mmol/L   Chloride 108 101 - 111 mmol/L   CO2 25 22 - 32 mmol/L   Glucose, Bld 91 65 - 99 mg/dL   BUN 15 6 - 20 mg/dL   Creatinine, Ser 1.61 0.44 - 1.00 mg/dL   Calcium 9.8 8.9 - 09.6 mg/dL   Total Protein 7.6 6.5 - 8.1 g/dL   Albumin 4.3 3.5 - 5.0 g/dL   AST 21 15 - 41 U/L   ALT 21 14 - 54 U/L   Alkaline Phosphatase 53 38 - 126 U/L   Total Bilirubin 0.3 0.3 - 1.2 mg/dL   GFR calc non Af Amer >60 >60 mL/min   GFR calc Af Amer >60 >60 mL/min   Anion gap 6 5 - 15  Urinalysis, Routine w reflex microscopic (not at [facility])  Result Value Ref Range   Color, Urine YELLOW YELLOW   APPearance CLEAR CLEAR   Specific Gravity, Urine 1.020 1.005 - 1.030   pH 5.0 5.0 - 8.0   Glucose, UA NEGATIVE NEGATIVE mg/dL   Hgb urine dipstick TRACE (A) NEGATIVE   Bilirubin Urine NEGATIVE NEGATIVE   Ketones, ur NEGATIVE NEGATIVE mg/dL   Protein, ur NEGATIVE NEGATIVE mg/dL   Nitrite NEGATIVE NEGATIVE   Leukocytes, UA NEGATIVE NEGATIVE  Pregnancy, urine  Result Value Ref Range   Preg Test, Ur NEGATIVE NEGATIVE  Urine microscopic-add on   Result Value Ref Range   Squamous Epithelial / LPF 0-5 (A) NONE SEEN   WBC, UA 0-5 0 - 5 WBC/hpf   RBC / HPF 0-5 0 - 5 RBC/hpf   Bacteria, UA RARE (A) NONE SEEN   Mr Brain Wo Contrast  01/19/2015  CLINICAL DATA:  44 year old female with right-sided weakness for the last 3 weeks. Slurred speech at times. Only with dizziness currently. Initial encounter. EXAM: MRI HEAD WITHOUT CONTRAST TECHNIQUE: Multiplanar, multiecho pulse sequences of the brain and surrounding structures were obtained without intravenous contrast. COMPARISON:  03/14/2006 head CT.  No comparison brain MR. FINDINGS: Exam is motion degraded. Multiple attempts at scanning were performed. No acute infarct. No intracranial mass lesion noted on this unenhanced exam. No intracranial hemorrhage. No hydrocephalus. Major intracranial vascular structures are patent. Decreased signal intensity of bone marrow may be related to patient's habitus. Correlation with CBC to exclude anemia contributing to this appearance may be considered Mucosal thickening paranasal sinuses most notable involving the maxillary sinuses (and most prominent posterior right maxillary sinus). Partial opacification right mastoid air cells without obstructing lesion of the eustachian tube noted. Cervical medullary junction, pituitary region, pineal region and orbital structures unremarkable. IMPRESSION: Exam is motion degraded. Multiple attempts at scanning were performed. No acute infarct or intracranial hemorrhage. No intracranial mass lesion noted on this unenhanced exam. Decreased signal intensity of bone marrow may be related to patient's habitus. Correlation with CBC to exclude anemia contributing to this appearance may be considered. Mucosal thickening paranasal sinuses most notable involving the maxillary sinuses (and most prominent posterior right maxillary sinus). Partial opacification right mastoid air cells. Electronically Signed   By: Lacy Duverney M.D.   On:  01/19/2015 12:42       I have personally reviewed and evaluated these images and lab results as part of my medical decision-making.    MDM   I personally performed the services described in this documentation, which was scribed in my presence. The recorded information  has been reviewed and considered. Cathren Laine, MD  Reviewed nursing notes and prior charts for additional history.   Labs.    Given report right numbness, imbalance/dizziness, will get mri.  Mri neg acute.  Pt/fam express concerns re anxiety, depression - indicates no current tx for same.  Will get [facility] consult.   [facility] team is recommending inpatient psych tx, they are working on placement.  Will place psych holding orders.    Signed out to oncoming provider,  Dr Juleen China, to follow up with [facility] placement.      Cathren Laine, MD 01/19/15 1500

## 2015-01-19 NOTE — Clinical Social Work Note (Signed)
CSW spoke with MD to provide disposition and recommendation of inpatient treatment.  [facility] has a bed for pt 405 bed 2 once her labs are back and she is medically cleared. Patient's labs have Been ordered.  Elray Bubaegina Silveria Botz LCSW

## 2015-01-19 NOTE — ED Notes (Signed)
Patient discharged per Dr. Juleen ChinaKohut.  No-harm contract obtained.

## 2015-01-19 NOTE — Discharge Instructions (Signed)
Paresthesia Paresthesia is an abnormal burning or prickling sensation. This sensation is generally felt in the hands, arms, legs, or feet. However, it may occur in any part of the body. Usually, it is not painful. The feeling may be described as:  Tingling or numbness.  Pins and needles.  Skin crawling.  Buzzing.  Limbs falling asleep.  Itching. Most people experience temporary (transient) paresthesia at some time in their lives. Paresthesia may occur when you breathe too quickly (hyperventilation). It can also occur without any apparent cause. Commonly, paresthesia occurs when pressure is placed on a nerve. The sensation quickly goes away after the pressure is removed. For some people, however, paresthesia is a long-lasting (chronic) condition that is caused by an underlying disorder. If you continue to have paresthesia, you may need further medical evaluation. HOME CARE INSTRUCTIONS Watch your condition for any changes. Taking the following actions may help to lessen any discomfort that you are feeling:  Avoid drinking alcohol.  Try acupuncture or massage to help relieve your symptoms.  Keep all follow-up visits as directed by your health care provider. This is important. SEEK MEDICAL CARE IF:  You continue to have episodes of paresthesia.  Your burning or prickling feeling gets worse when you walk.  You have pain, cramps, or dizziness.  You develop a rash. SEEK IMMEDIATE MEDICAL CARE IF:  You feel weak.  You have trouble walking or moving.  You have problems with speech, understanding, or vision.  You feel confused.  You cannot control your bladder or bowel movements.  You have numbness after an injury.  You faint.   This information is not intended to replace advice given to you by your health care provider. Make sure you discuss any questions you have with your health care provider.   Document Released: 02/04/2002 Document Revised: 07/01/2014 Document Reviewed:  02/10/2014 Elsevier Interactive Patient Education 2016 [employer].  Suicidal Feelings: How to Help Yourself Suicide is the taking of one's own life. If you feel as though life is getting too tough to handle and are thinking about suicide, get help right away. To get help:  Call your local emergency services (911 in the U.S.).  Call a suicide hotline to speak with a trained counselor who understands how you are feeling. The following is a list of suicide hotlines in the [place]. For a list of hotlines in [place], visit InkDistributor.it.  1-800-273-TALK (902)817-8946).  1-800-SUICIDE 782-052-0646).  941-719-8928. This is a hotline for Spanish speakers.  4-696-295-2WUX 410-516-4718). This is a hotline for TTY users.  1-866-4-U-TREVOR 608-440-5594). This is a hotline for lesbian, gay, bisexual, transgender, or questioning youth.  Contact a crisis center or a local suicide prevention center. To find a crisis center or suicide prevention center:  Call your local hospital, clinic, community service organization, mental health center, social service provider, or health department. Ask for assistance in connecting to a crisis center.  Visit https://www.patel-king.com/ for a list of crisis centers in the [place], or visit www.suicideprevention.ca/thinking-about-suicide/find-a-crisis-centre for a list of centers in [place].  Visit the following websites:  National Suicide Prevention Lifeline: www.suicidepreventionlifeline.org  Hopeline: www.hopeline.com  [employer] for Suicide Prevention: https://www.ayers.com/  [employer] (for lesbian, gay, bisexual, transgender, or questioning youth): www.thetrevorproject.org HOW CAN I HELP MYSELF FEEL BETTER?  Promise yourself that you will not do anything drastic when you have suicidal feelings. Remember, there is hope. Many people have  gotten through suicidal thoughts and feelings, and you will, too. You may have gotten through  them before, and this proves that you can get through them again.  Let family, friends, teachers, or counselors know how you are feeling. Try not to isolate yourself from those who care about you. Remember, they will want to help you. Talk with someone every day, even if you do not feel sociable. Face-to-face conversation is best.  Call a mental health professional and see one regularly.  Visit your primary health care provider every year.  Eat a well-balanced diet, and space your meals so you eat regularly.  Get plenty of rest.  Avoid alcohol and drugs, and remove them from your home. They will only make you feel worse.  If you are thinking of taking a lot of medicine, give your medicine to someone who can give it to you one day at a time. If you are on antidepressants and are concerned you will overdose, let your health care provider know so he or she can give you safer medicines. Ask your mental health professional about the possible side effects of any medicines you are taking.  Remove weapons, poisons, knives, and anything else that could harm you from your home.  Try to stick to routines. Follow a schedule every day. Put self-care on your schedule.  Make a list of realistic goals, and cross them off when you achieve them. Accomplishments give a sense of worth.  Wait until you are feeling better before doing the things you find difficult or unpleasant.  Exercise if you are able. You will feel better if you exercise for even a half hour each day.  Go out in the sun or into nature. This will help you recover from depression faster. If you have a favorite place to walk, go there.  Do the things that have always given you pleasure. Play your favorite music, read a good book, paint a picture, play your favorite instrument, or do anything else that takes your mind off your depression if it is safe  to do.  Keep your living space well lit.  When you are feeling well, write yourself a letter about tips and support that you can read when you are not feeling well.  Remember that life's difficulties can be sorted out with help. Conditions can be treated. You can work on thoughts and strategies that serve you well.   This information is not intended to replace advice given to you by your health care provider. Make sure you discuss any questions you have with your health care provider.   Document Released: 08/21/2002 Document Revised: 03/07/2014 Document Reviewed: 06/11/2013 Elsevier Interactive Patient Education 2016 [employer]   Emergency Department Resource Guide 1) Find a Doctor and Pay Out of Pocket Although you won't have to find out who is covered by your insurance plan, it is a good idea to ask around and get recommendations. You will then need to call the office and see if the doctor you have chosen will accept you as a new patient and what types of options they offer for patients who are self-pay. Some doctors offer discounts or will set up payment plans for their patients who do not have insurance, but you will need to ask so you aren't surprised when you get to your appointment.  2) Contact Your Local Health Department Not all health departments have doctors that can see patients for sick visits, but many do, so it is worth a call to see if yours does. If you don't know where your local health department is, you  can check in your phone book. The CDC also has a tool to help you locate your state's health department, and many state websites also have listings of all of their local health departments.  3) Find a [facility] If your illness is not likely to be very severe or complicated, you may want to try a walk in clinic. These are popping up all over the country in pharmacies, drugstores, and shopping centers. They're usually staffed by nurse practitioners or physician  assistants that have been trained to treat common illnesses and complaints. They're usually fairly quick and inexpensive. However, if you have serious medical issues or chronic medical problems, these are probably not your best option.  No Primary Care Doctor: - Call Health Connect at  [phone] - they can help you locate a primary care doctor that  accepts your insurance, provides certain services, etc. - Physician Referral Service- [phone]  Chronic Pain Problems: Organization         Address  Phone   Notes  [facility]  [phone] Patients need to be referred by their primary care doctor.   Medication Assistance: Organization         Address  Phone   Notes  [facility] Medication [facility] [address] [place], [place] [address] [phone] --Must be a resident of [facility] -- Must have NO insurance coverage whatsoever (no Medicaid/ Medicare, etc.) -- The pt. MUST have a primary care doctor that directs their care regularly and follows them in the community   MedAssist  [phone]   [employer]  [phone]    Agencies that provide inexpensive medical care: Organization         Address  Phone   Notes  [place]  [phone]   [facility]    [phone]   [facility] [place], [place] [address] [phone]   [place] [address] [place] [phone]   Planned Parenthood    [phone]   [facility]    [phone]   [place] and [place] [address], [place] Phone:  [phone], Fax:  [phone] Hours of Operation:  9 am - 6 pm, M-F.  Also accepts Medicaid/Medicare and self-pay.  [facility] for [place], [address], [place] Phone: [phone], Fax: [phone]. Hours of Operation:  8:30 am - 5:30 pm, M-F.  Also accepts  Medicaid and self-pay.  [facility] [address] [place] Phone: [phone]   [place], [place], [place] [phone], Ext. [phone] Mondays & Thursdays: 7-9 AM.  First 15 patients are seen on a first come, first serve basis.    [place] Providers:  Organization         Address  Phone   Notes  [facility] [address], [place] [phone] Also accepts self-pay patients.  [facility] [address], [place]  [phone]   [place], [address], [place] [phone]   [facility] [address] [place] [phone]   Lucianne Lei [address], [place]   [phone] Only accepts [place] Access [place] patients after they have their name applied  to their card.   Self-Pay (no insurance) in [facility]:  Organization         Address  Phone   Notes  Sickle Cell Patients, [facility] Internal Medicine [place] [phone]   [facility] [place] [phone]   [facility] [place]  [place], [place], [place] [phone]   [facility]/Dr. Osei-Bonsu  [address], [place] or [address], [place] [phone] Phone number for both [place] and [place] locations is the same.  Urgent Medical and [facility] [address], [place] [phone]   [facility] [address] [place] or [address] [phone]   [facility] [address], [place] [phone], phone; [phone], fax Sees patients 1st and 3rd Saturday of every month.  Must not qualify for public or private insurance (i.e. Medicaid, Medicare, [place] Health Choice, Veterans' Benefits)  Household  income should be no more than 200% of the poverty level The clinic cannot treat you if you are pregnant or think you are pregnant  Sexually transmitted diseases are not treated at the clinic.    Dental Care: Organization         Address  Phone  Notes  [facility] Department of [facility] [place] [phone] Accepts children up to age 57 who are enrolled in [place] or [place]; pregnant women with a Medicaid card; and children who have applied for Medicaid or [place] Health Choice, but were declined, whose parents can pay a reduced fee at time of service.  [facility] Department of [facility]  [address], [place] [phone] Accepts children up to age 52 who are enrolled in [place] or [place]; pregnant women with a Medicaid card; and children who have applied for Medicaid or [place] Health Choice, but were declined, whose parents can pay a reduced fee at time of service.  [place] Adult Dental Access PROGRAM  [place] [phone] Patients are seen by appointment only. Walk-ins are not accepted. [place] will see patients 20 years of age and older. Monday - Tuesday (8am-5pm) Most Wednesdays (8:30-5pm) $30 per visit, cash only  [facility] Adult Dental Access PROGRAM  [address], [facility] [phone] Patients are seen by appointment only. Walk-ins are not accepted. [place] will see patients 50 years of age and older. One Wednesday Evening (Monthly: Volunteer Based).  $30 per visit, cash only  [place]  [phone] for adults; Children under age 18, call Graduate Pediatric Dentistry at [phone]. Children aged 75-14, please call [phone] to request a pediatric application.  Dental services are provided in all areas of dental care including fillings, crowns and bridges, complete and partial dentures, implants, gum  treatment, root canals, and extractions. Preventive care is also provided. Treatment is provided to both adults and children. Patients are selected via a lottery and there is often a waiting list.   [facility] [address], [place]  [phone] www.drcivils.com   [facility] [address] [place], [place] [phone], Ext. [phone] Second and Fourth Thursday of each month, opens at 6:30 AM; Clinic ends at 9 AM.  Patients are seen on a first-come first-served  basis, and a limited number are seen during each clinic.   [facility]  [address] Hillard Danker [place], [place] [phone]   Eligibility Requirements You must have lived in [place], [place], or [place] counties for at least the last three months.   You cannot be eligible for state or federal sponsored [employer], including [employer], [place], or [employer].   You generally cannot be eligible for healthcare insurance through your employer.    How to apply: Eligibility screenings are held every Tuesday and Wednesday afternoon from 1:00 pm until 4:00 pm. You do not need an appointment for the interview!  [facility] [address], [place], [place]   [place]  [place] Department  [place]  [phone]    Behavioral Health Resources in the Community: Intensive Outpatient Programs Organization         Address  Phone  Notes  [facility] [place]. [address] [place], [place] [phone]   [facility] Outpatient [address], [place], [place]   ADS: Alcohol & Drug Svcs [address], [place], [place]   [place] [address],  [place], [place] or [phone]   Substance Abuse Resources Organization         Address  Phone  Notes  Alcohol and  Drug Services  [phone]   [place]  [phone]   [facility] [place]   Chinita Pester  [phone]   Residential & Outpatient Substance Abuse Program  [phone]   Psychological Services Organization         Address  Phone  Notes  [facility] [place]  [place]  [phone]   [place] [address] [place] or [phone]    Mobile Crisis Teams Organization         Address  Phone  Notes  [facility], Mobile Crisis Care Unit  [phone]   [facility] Psychotherapeutic [facility]  [address] [place], [place]   [place] [address] [place] [phone]    Self-Help/Support Groups Organization         Address  Phone             Notes  [place]. of [place] - variety of support groups  [place] Call for more information  Narcotics Anonymous (NA), [facility] [address], [employer] [place]  2 meetings at this location   Special educational needs teacher         Address  Phone  Notes  ASAP Residential Treatment [place],    [place]  [phone]   [facility]  [address] [place] [id number], [place], [place]   [place], [place] [phone] Admissions: 8am-3pm M-F  Incentives Substance [place] [address]    [place], [place] [phone]   [facility] [address] Jadene Pierini [place], [place]   [facility] [address]  [place], [place]   [facility] Programs - Intensive Outpatient [address] Kristeen Mans [address], [place], [place]   [facility].,  [place], [place] or [phone]   [facility] Treatment Services (RTS) [address], [place], [place] Accepts Medicaid  Fellowship  [address],  [place] [phone] Substance Abuse/Addiction Treatment   [facility] Organization         Address  Phone  Notes  [facility]  [phone]   Domenic Schwab, PhD [address] [place], [place]   [phone] or [phone]   [place], [place] [phone]   [place] [address], [place], [place] [phone] Insurance/Medicaid/sponsorship through [facility] Transitional Care & Rehabilitation and Families [address] [place]                                    [place], [place] [phone] [place] [address] [place], [place] [phone]    Dr. Adele Schilder  [phone]   [facility] Dept. 1) [address] [place] 2) [place] 3)  [place] [address], [place] [phone]  [phone]   [place] [phone] or [phone] (After Hours)

## 2015-01-19 NOTE — ED Notes (Signed)
Patient got up and went to bathroom to urinate without informing nursing staff.  I informed patient again that we need a urine sample. Placed specimen cup at bedside.

## 2015-01-19 NOTE — ED Provider Notes (Signed)
Pt intially presenting with paresthesia but also concerns for depression/psychiatric illness. Evaluated by TTS who is recommending inpatient treatment. Pt is interested in help but cannot stay for inpatient treatment at this time. They cannot arrange for childcare and husband cannot miss work. For me she is calm and cooperative. Answers questions appropriately. Does not appear to be responding to internal stimuli. Admits to depression but says she would never hurt herself. Her children seem generally well taken care and see seems appropriately concerned for their well being over her own. She desires further psychiatric help and shows desire to pursue this as an outpatient. Although TTS is recommending inpatient treatment, I feel involuntarily committing her would actually be more detrimental to her and her family than discharging her for outpatient follow-up.   Raeford RazorStephen Aluel Schwarz, MD 02/01/15 [phone]

## 2015-01-19 NOTE — BH Assessment (Addendum)
Tele Assessment Note   Deborah BaldingShannon M Arnold is an 44 y.o. female who was brought to the [facility] by her husband for medical clearance. Patient's husband was concerned that patient had an eye that was "drooping". Patient's husband was also  Concerned because patient had been experiencing hallucinations and appeared "depressed".  Patient reported a history of postpartum depression with 2 of her 3 children.  She reported feeling "overwhelmed" With all that she needs to do for her children and stated "I need something to help with my nerves."   Patient Reported a history of alcohol abuse that led to a DWI a couple of years ago.  She reported that she currently has No drivers license and feels "stuck" in her home all day unable to go out with her children.  She reported that She no longer drinks and that her last drink was 6 months ago.  Patient reported sadness "all the time", crying, anxiety, problems with memory, suicidal thoughts with no plan And audio hallucinations.  Patient reported that her suicidal ideations started a few months ago and her  Hallucinations began then as well.  Patient reported that she hears voices that are "people putting me down and telling me  how bad A person I am,"  She reported that the hallucinations are "getting worse."  Patient denied any past suicide attempts, self harm, inpatient treatment, homicidal ideations, aggressive acts, Drug or current alcohol use.  She reported a history of outpatient treatment for her alcohol abuse a year ago.  She also reported a history of trying lexepro and xanax but stated she is not currently on any psychotropic  Medications and does not see a psychiatrist.  Patient's husband provided collaterals and confirmed the same information above that was provided by patient.   Consulted with NP Vernona RiegerLaura who recommended in patient treatment after patient is medically cleared.  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 Preterm labor   . Abortion     x2  . Urinary tract infection   . Family history of anesthesia complication     " my son has malignant hypothermia "    Past Surgical History  Procedure Laterality Date  . Knee cartilage surgery  1/08  . Induced abortion      x2  . Tubal ligation Bilateral 05/18/2012    Procedure: POST PARTUM TUBAL LIGATION;  Surgeon: Adam PhenixJames G Arnold, MD;  Location: [facility];  Service: Gynecology;  Laterality: Bilateral;  . Orif ankle fracture Right 08/28/2012    Dr Lajoyce Cornersuda  . Orif ankle fracture Right 08/28/2012    Procedure: OPEN REDUCTION INTERNAL FIXATION (ORIF) RIGHT TRIMALEOLAR  ANKLE FRACTURE;  Surgeon: Nadara MustardMarcus V [name], MD;  Location: [facility];  Service: Orthopedics;  Laterality: Right;    Family History:  Family History  Problem Relation Age of Onset  . Cancer Other     Breast-maternal side    Social History:  reports that she has been smoking Cigarettes.  She has a 13 pack-year smoking history. She has never used smokeless tobacco. She reports that she drinks about 0.6 oz of alcohol per week. She reports that she does not use illicit drugs.  Additional Social  History:  Alcohol / Drug Use Pain Medications:  (see medical chart) Prescriptions:  (see medical chart) Over the Counter:  (see medical chart) History of alcohol / drug use?: Yes Longest period of sobriety (when/how long):  (6 months) Negative Consequences of Use: Legal, Financial Substance #1 Name of Substance 1:  (alcohol) 1 - Age of First Use:  (24) 1 - Amount (size/oz):  (varied) 1 - Frequency:  (daily) 1 - Duration:  (6 years) 1 - Last Use / Amount:  (6 months ago)  CIWA: CIWA-Ar BP: 136/98 mmHg Pulse Rate: 78 COWS:    PATIENT STRENGTHS: (choose at least two) Communication  skills Supportive family/friends  Allergies: No Known Allergies  Home Medications:  (Not in a hospital admission)  OB/GYN Status:  Patient's last menstrual period was 12/29/2014.  General Assessment Data Location of Assessment: [facility] TTS Assessment: In system Is this a Tele or Face-to-Face Assessment?: Tele Assessment Is this an Initial Assessment or a Re-assessment for this encounter?: Initial Assessment Marital status: Married [place] name:  (unknown) Is patient pregnant?: No Pregnancy Status: No Living Arrangements: Spouse/significant other, Children Can pt return to current living arrangement?: Yes Admission Status: Other (Comment) Is patient capable of signing voluntary admission?: Yes Referral Source: MD Insurance type:  (medicaid)  Medical Screening Exam [facility] Walk-in ONLY) Medical Exam completed: No Reason for MSE not completed:  (in process)  Crisis Care Plan Living Arrangements: Spouse/significant other, Children Name of Psychiatrist:  (none) Name of Therapist:  (none)  Education Status Is patient currently in school?: No Current Grade:  (none) Highest grade of school patient has completed:  (12) Name of school:  (n/a) Contact person:  (n/a)  Risk to self with the past 6 months Suicidal Ideation: No Has patient been a risk to self within the past 6 months prior to admission? : Yes Suicidal Intent: No Has patient had any suicidal intent within the past 6 months prior to admission? : Yes Is patient at risk for suicide?: No Suicidal Plan?: No Has patient had any suicidal plan within the past 6 months prior to admission? : No Access to Means: No What has been your use of drugs/alcohol within the last 12 months?:  (none) Previous Attempts/Gestures: No How many times?:  (0) Other Self Harm Risks:  (none) Triggers for Past Attempts:  (no past attempts) Intentional Self Injurious Behavior: None Family Suicide History: No Recent stressful life event(s): Other  (Comment) (cannot drive feels stuck in house) Persecutory voices/beliefs?: Yes Depression: Yes Depression Symptoms: Despondent, Insomnia, Tearfulness, Fatigue, Guilt, Loss of interest in usual pleasures, Feeling worthless/self pity, Feeling angry/irritable Substance abuse history and/or treatment for substance abuse?: Yes Suicide prevention information given to non-admitted patients: Not applicable  Risk to Others within the past 6 months Homicidal Ideation: No Does patient have any lifetime risk of violence toward others beyond the six months prior to admission? : No Thoughts of Harm to Others: No Current Homicidal Intent: No Current Homicidal Plan: No Access to Homicidal Means: No Identified Victim:  (none) History of harm to others?: No Assessment of Violence: None Noted Violent Behavior Description:  (none) Does patient have access to weapons?: No Criminal Charges Pending?: No Does patient have a court date: No Is patient on probation?: No  Psychosis Hallucinations: Auditory Delusions: None noted  Mental Status Report Appearance/Hygiene: In scrubs Eye Contact: Good Motor Activity: Freedom of movement Speech: Logical/coherent Level of Consciousness: Quiet/awake Mood: Depressed Affect: Depressed Anxiety Level: Minimal Thought Processes: Coherent, Relevant Judgement: Partial Orientation: Person,  Place, Time, Situation Obsessive Compulsive Thoughts/Behaviors: None  Cognitive Functioning Concentration: Decreased Memory: Recent Impaired, Remote Impaired IQ: Average Insight: Poor Impulse Control: Poor Appetite: Good Weight Loss:  (none) Weight Gain:  (none) Sleep: Decreased Total Hours of Sleep:  (3 hours then interrupted) Vegetative Symptoms: None  ADLScreening [facility] Assessment Services) Patient's cognitive ability adequate to safely complete daily activities?: Yes Patient able to express need for assistance with ADLs?: Yes Independently performs ADLs?: Yes  (appropriate for developmental age)  Prior Inpatient Therapy Prior Inpatient Therapy: No Prior Therapy Dates:  (none) Prior Therapy Facilty/Provider(s):  (none) Reason for Treatment:  (none)  Prior Outpatient Therapy Prior Outpatient Therapy: Yes Prior Therapy Dates:  (2015) Prior Therapy Facilty/Provider(s):  (counseling resolution) Reason for Treatment:  (alcohol) Does patient have an ACCT team?: No Does patient have Intensive In-House Services?  : No Does patient have [facility] services? : No Does patient have P4CC services?: No  ADL Screening (condition at time of admission) Patient's cognitive ability adequate to safely complete daily activities?: Yes Is the patient deaf or have difficulty hearing?: No Does the patient have difficulty seeing, even when wearing glasses/contacts?: No Does the patient have difficulty concentrating, remembering, or making decisions?: No Patient able to express need for assistance with ADLs?: Yes Does the patient have difficulty dressing or bathing?: No Independently performs ADLs?: Yes (appropriate for developmental age) Does the patient have difficulty walking or climbing stairs?: No Weakness of Legs: None Weakness of Arms/Hands: None  Home Assistive Devices/Equipment Home Assistive Devices/Equipment: None  Therapy Consults (therapy consults require a physician order) PT Evaluation Needed: No OT Evalulation Needed: No SLP Evaluation Needed: No Abuse/Neglect Assessment (Assessment to be complete while patient is alone) Physical Abuse: Denies Verbal Abuse: Denies Sexual Abuse: Denies Exploitation of patient/patient's resources: Denies Self-Neglect: Denies Values / Beliefs Cultural Requests During Hospitalization: None Spiritual Requests During Hospitalization: None Consults Spiritual Care Consult Needed: No Social Work Consult Needed: No Merchant navy officer (For Healthcare) Does patient have an advance directive?: No Would patient like  information on creating an advanced directive?: No - patient declined information    Additional Information 1:1 In Past 12 Months?: No CIRT Risk: No Elopement Risk: No Does patient have medical clearance?: No     Disposition:  Disposition Initial Assessment Completed for this Encounter: Yes  Annetta Maw 01/19/2015 2:30 PM

## 2015-01-19 NOTE — ED Notes (Signed)
Called [employer] for [employer] earlier to cancel driver for Pt.  Change of plans.

## 2015-01-19 NOTE — ED Notes (Signed)
Having numbness to right side for last 3 weeks.  denies any headache at this time, c/o slurred speech at times.  Only having dizziness now.

## 2015-01-19 NOTE — ED Notes (Signed)
Spoke with pt's husband without pt in room and husband states that pt has been hallucinating and talking to things that are not present.  States that pt will "go ballistic" if this is mentioned and he wonders if pt is not bipolar or schizophrenic.  States that pt has not been psychiatrically evaluated due to denial of these symptoms.

## 2015-01-19 NOTE — ED Notes (Signed)
Informed patient that we needed a urine sample. Patient states that she just went and could not give us a sample yet.

## 2015-06-27 ENCOUNTER — Ambulatory Visit (HOSPITAL_COMMUNITY)
Admission: EM | Admit: 2015-06-27 | Discharge: 2015-06-27 | Disposition: A | Discharge status: Home / Self Care | Payer: Medicaid Other | Attending: Emergency Medicine | Admitting: Emergency Medicine

## 2015-06-27 ENCOUNTER — Encounter (HOSPITAL_COMMUNITY): Payer: Self-pay | Admitting: Emergency Medicine

## 2015-06-27 DIAGNOSIS — K047 Periapical abscess without sinus: Secondary | ICD-10-CM

## 2015-06-27 MED ORDER — CLINDAMYCIN HCL 300 MG PO CAPS: 300.0000 mg | ORAL_CAPSULE | Freq: Three times a day (TID) | ORAL | Status: DC

## 2015-06-27 MED ORDER — HYDROCODONE-ACETAMINOPHEN 5-325 MG PO TABS: 2.0000 | ORAL_TABLET | ORAL | Status: DC | PRN

## 2015-06-27 NOTE — Discharge Instructions (Signed)
Dental Abscess °A dental abscess is a collection of pus in or around a tooth. °CAUSES °This condition is caused by a bacterial infection around the root of the tooth that involves the inner part of the tooth (pulp). It may result from: °· Severe tooth decay. °· Trauma to the tooth that allows bacteria to enter into the pulp, such as a broken or chipped tooth. °· Severe gum disease around a tooth. °SYMPTOMS °Symptoms of this condition include: °· Severe pain in and around the infected tooth. °· Swelling and redness around the infected tooth, in the mouth, or in the face. °· Tenderness. °· Pus drainage. °· Bad breath. °· Bitter taste in the mouth. °· Difficulty swallowing. °· Difficulty opening the mouth. °· Nausea. °· Vomiting. °· Chills. °· Swollen neck glands. °· Fever. °DIAGNOSIS °This condition is diagnosed with examination of the infected tooth. During the exam, your dentist may tap on the infected tooth. Your dentist will also ask about your medical and dental history and may order X-rays. °TREATMENT °This condition is treated by eliminating the infection. This may be done with: °· Antibiotic medicine. °· A root canal. This may be performed to save the tooth. °· Pulling (extracting) the tooth. This may also involve draining the abscess. This is done if the tooth cannot be saved. °HOME CARE INSTRUCTIONS °· Take medicines only as directed by your dentist. °· If you were prescribed antibiotic medicine, finish all of it even if you start to feel better. °· Rinse your mouth (gargle) often with salt water to relieve pain or swelling. °· Do not drive or operate heavy machinery while taking pain medicine. °· Do not apply heat to the outside of your mouth. °· Keep all follow-up visits as directed by your dentist. This is important. °SEEK MEDICAL CARE IF: °· Your pain is worse and is not helped by medicine. °SEEK IMMEDIATE MEDICAL CARE IF: °· You have a fever or chills. °· Your symptoms suddenly get worse. °· You have a  very bad headache. °· You have problems breathing or swallowing. °· You have trouble opening your mouth. °· You have swelling in your neck or around your eye. °  °This information is not intended to replace advice given to you by your health care provider. Make sure you discuss any questions you have with your health care provider. °  °Document Released: 02/14/2005 Document Revised: 07/01/2014 Document Reviewed: 02/11/2014 °Elsevier Interactive Patient Education ©2016 [employer] ° °Dental Care and Dentist Visits °Dental care supports good overall health. Regular dental visits can also help you avoid dental pain, bleeding, infection, and other more serious health problems in the future. It is important to keep the mouth healthy because diseases in the teeth, gums, and other oral tissues can spread to other areas of the body. Some problems, such as diabetes, heart disease, and pre-term labor have been associated with poor oral health.  °See your dentist every 6 months. If you experience emergency problems such as a toothache or broken tooth, go to the dentist right away. If you see your dentist regularly, you may catch problems early. It is easier to be treated for problems in the early stages.  °WHAT TO EXPECT AT A DENTIST VISIT  °Your dentist will look for many common oral health problems and recommend proper treatment. At your regular dental visit, you can expect: °· Gentle cleaning of the teeth and gums. This includes scraping and polishing. This helps to remove the sticky substance around the teeth and gums (  plaque). Plaque forms in the mouth shortly after eating. Over time, plaque hardens on the teeth as tartar. If tartar is not removed regularly, it can cause problems. Cleaning also helps remove stains. °· Periodic X-rays. These pictures of the teeth and supporting bone will help your dentist assess the health of your teeth. °· Periodic fluoride treatments. Fluoride is a natural mineral shown to help  strengthen teeth. Fluoride treatment involves applying a fluoride gel or varnish to the teeth. It is most commonly done in children. °· Examination of the mouth, tongue, jaws, teeth, and gums to look for any oral health problems, such as: °¨ Cavities (dental caries). This is decay on the tooth caused by plaque, sugar, and acid in the mouth. It is best to catch a cavity when it is small. °¨ Inflammation of the gums caused by plaque buildup (gingivitis). °¨ Problems with the mouth or malformed or misaligned teeth. °¨ Oral cancer or other diseases of the soft tissues or jaws.  °KEEP YOUR TEETH AND GUMS HEALTHY °For healthy teeth and gums, follow these general guidelines as well as your dentist's specific advice: °· Have your teeth professionally cleaned at the dentist every 6 months. °· Brush twice daily with a fluoride toothpaste. °· Floss your teeth daily.  °· Ask your dentist if you need fluoride supplements, treatments, or fluoride toothpaste. °· Eat a healthy diet. Reduce foods and drinks with added sugar. °· Avoid smoking. °TREATMENT FOR ORAL HEALTH PROBLEMS °If you have oral health problems, treatment varies depending on the conditions present in your teeth and gums. °· Your caregiver will most likely recommend good oral hygiene at each visit. °· For cavities, gingivitis, or other oral health disease, your caregiver will perform a procedure to treat the problem. This is typically done at a separate appointment. Sometimes your caregiver will refer you to another dental specialist for specific tooth problems or for surgery. °SEEK IMMEDIATE DENTAL CARE IF: °· You have pain, bleeding, or soreness in the gum, tooth, jaw, or mouth area. °· A permanent tooth becomes loose or separated from the gum socket. °· You experience a blow or injury to the mouth or jaw area. °  °This information is not intended to replace advice given to you by your health care provider. Make sure you discuss any questions you have with your  health care provider. °  °Document Released: 10/27/2010 Document Revised: 05/09/2011 Document Reviewed: 10/27/2010 °Elsevier Interactive Patient Education ©2016 Elsevier Inc. ° °

## 2015-06-27 NOTE — ED Notes (Signed)
C/o left lower side tooth abscess onset 3 days... Pain is 10/10 A&O x4... No acute distress.

## 2015-06-27 NOTE — ED Provider Notes (Signed)
CSN: [id number]     Arrival date & time 06/27/15  1729 History   First MD Initiated Contact with Patient 06/27/15 1816     Chief Complaint  Patient presents with  . Dental Pain   (Consider location/radiation/quality/duration/timing/severity/associated sxs/prior Treatment) HPI History obtained from patient:  Pt presents with the cc of: dental pain and jaw swelling.  Duration of symptoms:2 days Treatment prior to arrival:OTC meds without relief Context:teeth are bad, sudden onset yesterday of pain , unable to dentist due to medicaid.  Other symptoms include:jaw swelling Pain score:8 Family History: breast Cancer mother's side SOCIAL HISTORY: +'ve smoker  Past Medical History  Diagnosis Date  . Preterm labor   . Abortion     x2  . Urinary tract infection   . Family history of anesthesia complication     " my son has malignant hypothermia "   Past Surgical History  Procedure Laterality Date  . Knee cartilage surgery  1/08  . Induced abortion      x2  . Tubal ligation Bilateral 05/18/2012    Procedure: POST PARTUM TUBAL LIGATION;  Surgeon: Adam PhenixJames G Arnold, MD;  Location: [facility];  Service: Gynecology;  Laterality: Bilateral;  . Orif ankle fracture Right 08/28/2012    Dr Lajoyce Cornersuda  . Orif ankle fracture Right 08/28/2012    Procedure: OPEN REDUCTION INTERNAL FIXATION (ORIF) RIGHT TRIMALEOLAR  ANKLE FRACTURE;  Surgeon: Nadara MustardMarcus V [name], MD;  Location: [facility];  Service: Orthopedics;  Laterality: Right;   Family History  Problem Relation Age of Onset  . Cancer Other     Breast-maternal side   Social History  Substance Use Topics  . Smoking status: Current Some Day Smoker -- 0.50 packs/day for 26 years    Types: Cigarettes  . Smokeless tobacco: Never Used  . Alcohol Use: 0.6 oz/week    1 Cans of beer per week     Comment: "1 beer a week for constipation" per patient-in early pregnancy   OB History    Gravida Para Term Preterm AB TAB SAB Ectopic Multiple Living   8 5 4 1 3 2 1   5       Review of Systems ROS +'ve dental pain, jaw swelling  Denies: HEADACHE, NAUSEA, ABDOMINAL PAIN, CHEST PAIN, CONGESTION, DYSURIA, SHORTNESS OF BREATH  Allergies  Review of patient's allergies indicates no known allergies.  Home Medications   Prior to Admission medications   Medication Sig Start Date End Date Taking? Authorizing Provider  acetaminophen (TYLENOL) 500 MG tablet Take 500 mg by mouth as needed for pain.    Historical Provider, MD  clindamycin (CLEOCIN) 300 MG capsule Take 1 capsule (300 mg total) by mouth 3 (three) times daily. 06/27/15   Tharon AquasFrank C Arran Fessel, PA  HYDROcodone-acetaminophen (NORCO) 5-325 MG per tablet Take 1 tablet by mouth every 6 (six) hours as needed for pain. Patient not taking: Reported on 01/19/2015 08/30/12   Nadara MustardMarcus [name] V, MD  HYDROcodone-acetaminophen (NORCO/VICODIN) 5-325 MG tablet Take 2 tablets by mouth every 4 (four) hours as needed. 06/27/15   Tharon AquasFrank C Shareka Casale, PA  ibuprofen (ADVIL,MOTRIN) 200 MG tablet Take 200 mg by mouth every 6 (six) hours as needed for moderate pain.    Historical Provider, MD   Meds Ordered and Administered this Visit  Medications - No data to display  BP 128/85 mmHg  Pulse 87  Temp(Src) 97.2 F (36.2 C) (Oral)  Resp 14  SpO2 97%  LMP 06/27/2015 No data found.   Physical Exam NURSES NOTES  AND VITAL SIGNS REVIEWED. CONSTITUTIONAL: Well developed, well nourished, no acute distress HEENT: normocephalic, atraumatic, Mouth: bottom right jaw red, swollen tender multiple teeth. No actual abscess palpated. Teeth in poor repair.  EYES: Conjunctiva normal NECK:normal ROM, supple, no adenopathy PULMONARY:No respiratory distress, normal effort ABDOMINAL: Soft, ND, NT BS+, No CVAT MUSCULOSKELETAL: Normal ROM of all extremities,  SKIN: warm and dry without rash PSYCHIATRIC: Mood and affect, behavior are normal  ED Course  Procedures (including critical care time)  Labs Review Labs Reviewed - No data to display  Imaging  Review No results found.   Visual Acuity Review  Right Eye Distance:   Left Eye Distance:   Bilateral Distance:    Right Eye Near:   Left Eye Near:    Bilateral Near:      rx clinda/norco   MDM   1. Dental infection      Patient is reassured that there are no issues that require transfer to higher level of care at this time or additional tests. Patient is advised to continue home symptomatic treatment. Patient is advised that if there are new or worsening symptoms to attend the emergency department, contact primary care provider, or return to UC. Instructions of care provided discharged home in stable condition.    THIS NOTE WAS GENERATED USING A VOICE RECOGNITION SOFTWARE PROGRAM. ALL REASONABLE EFFORTS  WERE MADE TO PROOFREAD THIS DOCUMENT FOR ACCURACY.  I have verbally reviewed the discharge instructions with the patient. A printed AVS was given to the patient.  All questions were answered prior to discharge.      Tharon Aquas, PA 06/27/15 [phone]

## 2015-12-31 ENCOUNTER — Encounter (HOSPITAL_COMMUNITY): Payer: Self-pay | Admitting: Emergency Medicine

## 2015-12-31 ENCOUNTER — Emergency Department (HOSPITAL_COMMUNITY)
Admission: EM | Admit: 2015-12-31 | Discharge: 2016-01-01 | Disposition: A | Discharge status: Home / Self Care | Payer: Medicaid Other | Attending: Emergency Medicine | Admitting: Emergency Medicine

## 2015-12-31 ENCOUNTER — Ambulatory Visit (HOSPITAL_COMMUNITY)
Admission: RE | Admit: 2015-12-31 | Discharge: 2015-12-31 | Disposition: A | Discharge status: Home / Self Care | Payer: Medicaid Other | Attending: Psychiatry | Admitting: Psychiatry

## 2015-12-31 DIAGNOSIS — Z803 Family history of malignant neoplasm of breast: Secondary | ICD-10-CM | POA: Diagnosis not present

## 2015-12-31 DIAGNOSIS — R45851 Suicidal ideations: Secondary | ICD-10-CM

## 2015-12-31 DIAGNOSIS — F331 Major depressive disorder, recurrent, moderate: Secondary | ICD-10-CM | POA: Diagnosis not present

## 2015-12-31 DIAGNOSIS — R440 Auditory hallucinations: Secondary | ICD-10-CM | POA: Insufficient documentation

## 2015-12-31 DIAGNOSIS — F17210 Nicotine dependence, cigarettes, uncomplicated: Secondary | ICD-10-CM | POA: Insufficient documentation

## 2015-12-31 DIAGNOSIS — Z008 Encounter for other general examination: Secondary | ICD-10-CM

## 2015-12-31 LAB — CBC
HEMATOCRIT: 44.7 % (ref 36.0–46.0)
Hemoglobin: 15.8 g/dL — ABNORMAL HIGH (ref 12.0–15.0)
MCH: 30.3 pg (ref 26.0–34.0)
MCHC: 35.3 g/dL (ref 30.0–36.0)
MCV: 85.8 fL (ref 78.0–100.0)
PLATELETS: 233 10*3/uL (ref 150–400)
RBC: 5.21 MIL/uL — AB (ref 3.87–5.11)
RDW: 15.2 % (ref 11.5–15.5)
WBC: 11 10*3/uL — AB (ref 4.0–10.5)

## 2015-12-31 LAB — RAPID URINE DRUG SCREEN, HOSP PERFORMED
AMPHETAMINES: NOT DETECTED
BENZODIAZEPINES: NOT DETECTED
Barbiturates: NOT DETECTED
Cocaine: NOT DETECTED
Opiates: NOT DETECTED
Tetrahydrocannabinol: POSITIVE — AB

## 2015-12-31 LAB — SALICYLATE LEVEL: Salicylate Lvl: <7 mg/dL (ref 2.8–30.0)

## 2015-12-31 LAB — ETHANOL

## 2015-12-31 LAB — HCG, QUANTITATIVE, PREGNANCY

## 2015-12-31 LAB — COMPREHENSIVE METABOLIC PANEL
ALBUMIN: 5.1 g/dL — AB (ref 3.5–5.0)
ALT: 18 U/L (ref 14–54)
AST: 21 U/L (ref 15–41)
Alkaline Phosphatase: 56 U/L (ref 38–126)
Anion gap: 5 (ref 5–15)
BUN: 8 mg/dL (ref 6–20)
CHLORIDE: 110 mmol/L (ref 101–111)
CO2: 22 mmol/L (ref 22–32)
Calcium: 9.3 mg/dL (ref 8.9–10.3)
Creatinine, Ser: 0.77 mg/dL (ref 0.44–1.00)
GFR calc Af Amer: >60 mL/min (ref >60)
GFR calc non Af Amer: >60 mL/min (ref >60)
GLUCOSE: 91 mg/dL (ref 65–99)
POTASSIUM: 3.3 mmol/L — AB (ref 3.5–5.1)
SODIUM: 137 mmol/L (ref 135–145)
Total Bilirubin: 0.9 mg/dL (ref 0.3–1.2)
Total Protein: 8.6 g/dL — ABNORMAL HIGH (ref 6.5–8.1)

## 2015-12-31 LAB — ACETAMINOPHEN LEVEL: Acetaminophen (Tylenol), Serum: <10 ug/mL — ABNORMAL LOW (ref 10–30)

## 2015-12-31 NOTE — ED Triage Notes (Signed)
Pt presents with neighbor with complaints of auditory hallucinations and states that the voices are constantly negative and trying to get her to kill herself, wreck her car, tell her her children and husband hate her, and that she is a terrible person.  Pt denise current suicidal ideations but states that she has them often.  Pt cut her wrists last week but states that she "couldn't get it to go deep enough".  Pt states that she does not have a plan to harm herself right now but that her plan often changes.  Pt wants to get help and "just wants the voices to stop, even if she has to kill herself."

## 2015-12-31 NOTE — H&P (Signed)
Behavioral Health Medical Screening Exam  Deborah BaldingShannon M Arnold is an 45 y.o. female.  Total Time spent with patient: 15 minutes  Psychiatric Specialty Exam: Physical Exam  Constitutional: She is oriented to person, place, and time. She appears well-developed and well-nourished.  HENT:  Head: Normocephalic.  Eyes: Pupils are equal, round, and reactive to light.  Neck: Normal range of motion.  Cardiovascular: Normal rate, regular rhythm and normal heart sounds.   Respiratory: Effort normal and breath sounds normal.  GI: Soft.  Musculoskeletal: Normal range of motion.  Neurological: She is alert and oriented to person, place, and time.  Skin: Skin is warm and dry.    Review of Systems  Psychiatric/Behavioral: Positive for depression, hallucinations (auditory) and suicidal ideas (without plan). Negative for memory loss and substance abuse. The patient is nervous/anxious. The patient does not have insomnia.     Blood pressure (!) 139/99, pulse 62, temperature 99 F (37.2 C), temperature source Oral, resp. rate 18, SpO2 100 %.There is no height or weight on file to calculate BMI.  General Appearance: Casual and Fairly Groomed  Eye Contact:  Good  Speech:  Clear and Coherent and Normal Rate  Volume:  Normal  Mood:  Anxious, Depressed and Irritable  Affect:  Congruent, Depressed and Flat  Thought Process:  Disorganized  Orientation:  Full (Time, Place, and Person)  Thought Content:  Hallucinations: Auditory, is responding to internal stimuli  Suicidal Thoughts:  Yes.  without intent/plan, has attempted in past  Homicidal Thoughts:  Yes.  without intent/plan, feels homicidal toward family members at times  Memory:  Immediate;   Fair Recent;   Fair Remote;   Fair  Judgement:  Fair  Insight:  Fair  Psychomotor Activity:  Increased  Concentration: Concentration: Fair and Attention Span: Fair  Recall:  [employer]Fair  Fund of Knowledge:Fair  Language: Good  Akathisia:  No  Handed:  Right  AIMS  (if indicated):     Assets:  Desire for Improvement Financial Resources/Insurance Housing Resilience Social Support Transportation  Sleep:       Musculoskeletal: Strength & Muscle Tone: within normal limits Gait & Station: normal Patient leans: N/A  Blood pressure (!) 139/99, pulse 62, temperature 99 F (37.2 C), temperature source Oral, resp. rate 18, SpO2 100 %.  Recommendations:  Based on my evaluation the patient appears to have an emergency medical condition for which I recommend the patient be transferred to the emergency department for further evaluation.  Laveda AbbeLaurie Britton Parks, NP 12/31/2015, 7:49 PM

## 2015-12-31 NOTE — ED Notes (Signed)
Pt has in belonging bag:  Purple shirt, blue jeans, blue and grey shoes.

## 2016-01-01 DIAGNOSIS — Z803 Family history of malignant neoplasm of breast: Secondary | ICD-10-CM

## 2016-01-01 DIAGNOSIS — F331 Major depressive disorder, recurrent, moderate: Secondary | ICD-10-CM | POA: Diagnosis present

## 2016-01-01 DIAGNOSIS — F17210 Nicotine dependence, cigarettes, uncomplicated: Secondary | ICD-10-CM | POA: Diagnosis not present

## 2016-01-01 DIAGNOSIS — R440 Auditory hallucinations: Secondary | ICD-10-CM | POA: Diagnosis not present

## 2016-01-01 DIAGNOSIS — Z79899 Other long term (current) drug therapy: Secondary | ICD-10-CM

## 2016-01-01 LAB — URINALYSIS, ROUTINE W REFLEX MICROSCOPIC
Bilirubin Urine: NEGATIVE
GLUCOSE, UA: NEGATIVE mg/dL
HGB URINE DIPSTICK: NEGATIVE
Ketones, ur: NEGATIVE mg/dL
LEUKOCYTES UA: NEGATIVE
Nitrite: NEGATIVE
PH: 6 (ref 5.0–8.0)
PROTEIN: NEGATIVE mg/dL
SPECIFIC GRAVITY, URINE: 1.022 (ref 1.005–1.030)

## 2016-01-01 MED ORDER — ONDANSETRON HCL 4 MG PO TABS: 4.0000 mg | ORAL_TABLET | Freq: Three times a day (TID) | ORAL | Status: DC | PRN

## 2016-01-01 MED ORDER — NICOTINE 21 MG/24HR TD PT24: 21.0000 mg | MEDICATED_PATCH | Freq: Every day | TRANSDERMAL | Status: DC

## 2016-01-01 MED ORDER — LORAZEPAM 1 MG PO TABS
1.0000 mg | ORAL_TABLET | Freq: Three times a day (TID) | ORAL | Status: DC | PRN
  Administered 2016-01-01: 1 mg via ORAL
  Filled 2016-01-01: qty 1

## 2016-01-01 MED ORDER — ACETAMINOPHEN 325 MG PO TABS: 650.0000 mg | ORAL_TABLET | ORAL | Status: DC | PRN

## 2016-01-01 MED ORDER — IBUPROFEN 200 MG PO TABS: 600.0000 mg | ORAL_TABLET | Freq: Three times a day (TID) | ORAL | Status: DC | PRN

## 2016-01-01 MED ORDER — ZOLPIDEM TARTRATE 5 MG PO TABS: 5.0000 mg | ORAL_TABLET | Freq: Every evening | ORAL | Status: DC | PRN

## 2016-01-01 MED ORDER — FLUOXETINE HCL 20 MG PO CAPS: 20.0000 mg | ORAL_CAPSULE | Freq: Every day | ORAL | 1 refills | Status: DC

## 2016-01-01 MED ORDER — HYDROXYZINE HCL 25 MG PO TABS: 25.0000 mg | ORAL_TABLET | Freq: Four times a day (QID) | ORAL | 0 refills | Status: DC | PRN

## 2016-01-01 MED ORDER — ALUM & MAG HYDROXIDE-SIMETH 200-200-20 MG/5ML PO SUSP: 30.0000 mL | ORAL | Status: DC | PRN

## 2016-01-01 MED ORDER — FLUOXETINE HCL 20 MG PO CAPS
20.0000 mg | ORAL_CAPSULE | Freq: Every day | ORAL | Status: DC
  Administered 2016-01-01: 20 mg via ORAL
  Filled 2016-01-01: qty 1

## 2016-01-01 MED ORDER — HYDROXYZINE HCL 25 MG PO TABS
25.0000 mg | ORAL_TABLET | Freq: Four times a day (QID) | ORAL | Status: DC | PRN
  Filled 2016-01-01: qty 1

## 2016-01-01 NOTE — Discharge Instructions (Signed)
For your ongoing behavioral health needs, you are advised to follow up with one of the following providers.  Contact them at your earliest opportunity to ask about scheduling an intake appointment:       [facility]      [address]      [place], [place] [address]      [phone]) [phone]      New patients are seen at their walk-in clinic.  Walk-in hours are Monday - Friday from 8:00 am - 12:00 pm, and from 1:00 pm - 3:00 pm.  Walk-in patients are seen on a first come, first served basis, so try to arrive as early as possible for the best chance of being seen the same day.  There is an initial fee of $22.50.       [facility]      [address]      [place], [place] [address]      [phone]       [facility]      [address]      [place], [place] [address]      [phone]) [phone]      New and returning patients are seen at their walk-in clinic.  Walk-in hours are Monday - Friday from 8:00 am - 3:00 pm.  Walk-in patients are seen on a first come, first served basis.  Try to arrive as early as possible for he best chance of being seen the same day.

## 2016-01-01 NOTE — ED Notes (Signed)
Patty tolbert  [phone] Neighbor

## 2016-01-01 NOTE — ED Provider Notes (Signed)
[facility]-EMERGENCY DEPT Provider Note   CSN: [id number] Arrival date & time: 12/31/15  2021  By signing my name below, I, Octavia Heirrianna Nassar, attest that this documentation has been prepared under the direction and in the presence of Arvilla MeresAshley Shloime Keilman, PA-C.  Electronically Signed: Octavia HeirArianna Nassar, ED Scribe. 01/01/16. 12:54 AM.    History   Chief Complaint Chief Complaint  Patient presents with  . Hallucinations    auditory    The history is provided by the patient. No language interpreter was used.   HPI Comments: Deborah Arnold is a 45 y.o. female who presents to the Emergency Department complaining of auditory hallucinations. Pt has been having auditory hallucinations for the past two years that have gotten progressively worse. She notes having a lot of negative voices in her head that send her into crying phases. The voices have been telling her to "kill herself", "crash her car", "whore" and says that anything negative is told to her. Pt also expresses intermittent visual halluciations with "pictures of the past." She has a hx of self-harm and reports cutting her wrists last week. Pt has also been having increased weight loss and suicidal ideations. She reports having increased anxiety and depression. Pt says she has been a "home-body" with her children and has felt isolated. Pt is not on any medication for her mental health nor has she been hospitalized for her mental health. 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 Pt notes consuming ETOH when her nerves are high and consumes enough to calm her down. Denies HI, recreational drug use, or any current medical complaints. No medical complaints. Pt is a smoker.  Past Medical History:  Diagnosis Date  . Abortion    x2  . Family history of anesthesia complication    " my son has malignant hypothermia "  . Preterm labor   . Urinary tract infection     There are no active problems to display for this patient.   Past Surgical History:    Procedure Laterality Date  . INDUCED ABORTION     x2  . KNEE CARTILAGE SURGERY  1/08  . ORIF ANKLE FRACTURE Right 08/28/2012   Dr Lajoyce Cornersuda  . ORIF ANKLE FRACTURE Right 08/28/2012   Procedure: OPEN REDUCTION INTERNAL FIXATION (ORIF) RIGHT TRIMALEOLAR  ANKLE FRACTURE;  Surgeon: Nadara MustardMarcus V [name], MD;  Location: [facility] OR;  Service: Orthopedics;  Laterality: Right;  . TUBAL LIGATION Bilateral 05/18/2012   Procedure: POST PARTUM TUBAL LIGATION;  Surgeon: Adam PhenixJames G Arnold, MD;  Location: [facility] ORS;  Service: Gynecology;  Laterality: Bilateral;    OB History    Gravida Para Term Preterm AB Living   8 5 4 1 3 5    SAB TAB Ectopic Multiple Live Births   1 2     5        Home Medications    Prior to Admission medications   Medication Sig Start Date End Date Taking? Authorizing Provider  acetaminophen (TYLENOL) 500 MG tablet Take 500 mg by mouth as needed for pain.   Yes Historical Provider, MD  ibuprofen (ADVIL,MOTRIN) 200 MG tablet Take 200 mg by mouth every 6 (six) hours as needed for moderate pain.   Yes Historical Provider, MD  clindamycin (CLEOCIN) 300 MG capsule Take 1 capsule (300 mg total) by mouth 3 (three) times daily. Patient not taking: Reported on 12/31/2015 06/27/15   Tharon AquasFrank C Patrick, PA  HYDROcodone-acetaminophen (NORCO) 5-325 MG per tablet Take 1 tablet by mouth every 6 (six) hours as needed for pain.  Patient not taking: Reported on 01/19/2015 08/30/12   Nadara MustardMarcus V [name], MD  HYDROcodone-acetaminophen (NORCO/VICODIN) 5-325 MG tablet Take 2 tablets by mouth every 4 (four) hours as needed. Patient not taking: Reported on 12/31/2015 06/27/15   Tharon AquasFrank C Patrick, PA    Family History Family History  Problem Relation Age of Onset  . Cancer Other     Breast-maternal side    Social History Social History  Substance Use Topics  . Smoking status: Current Some Day Smoker    Packs/day: 0.50    Years: 26.00    Types: Cigarettes  . Smokeless tobacco: Never Used  . Alcohol use 0.6 oz/week    1 Cans of  beer per week     Comment: "1 beer a week for constipation" per patient-in early pregnancy     Allergies   Review of patient's allergies indicates no known allergies.   Review of Systems Review of Systems  Constitutional: Negative for fever.  HENT: Negative for trouble swallowing.   Eyes: Negative for visual disturbance.  Respiratory: Negative for cough and shortness of breath.   Cardiovascular: Negative for chest pain.  Gastrointestinal: Negative for abdominal pain, nausea and vomiting.  Genitourinary: Negative for dysuria and hematuria.  Musculoskeletal: Negative for arthralgias.  Skin: Negative for rash.  Neurological: Negative for syncope.  Psychiatric/Behavioral: Positive for hallucinations and suicidal ideas. The patient is nervous/anxious.   All other systems reviewed and are negative.    Physical Exam Updated Vital Signs BP 128/97 (BP Location: Left Arm)   Pulse 63   Temp 97.8 F (36.6 C) (Oral)   Resp 16   Ht 5\' 3"  (1.6 m)   Wt 147 lb (66.7 kg)   SpO2 100%   BMI 26.04 kg/m   Physical Exam  Constitutional: She appears well-developed and well-nourished. No distress.  HENT:  Head: Normocephalic and atraumatic.  Mouth/Throat: Oropharynx is clear and moist. No oropharyngeal exudate.  Eyes: Conjunctivae and EOM are normal. Right eye exhibits no discharge. Left eye exhibits no discharge. No scleral icterus. Right pupil is round and reactive. Left pupil is round and reactive. Pupils are unequal.  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   Neck: Normal range of motion and phonation normal. Neck supple. No neck rigidity. Normal range of motion present.  Cardiovascular: Normal rate, regular rhythm, normal heart sounds and intact distal pulses.   No murmur heard. Pulmonary/Chest: Effort normal and breath sounds normal. No stridor. No respiratory distress. She has no wheezes. She has no rales.  Abdominal: Soft. Bowel sounds are normal. She exhibits no  distension. There is no tenderness. There is no rigidity, no rebound, no guarding and no CVA tenderness.  Musculoskeletal: Normal range of motion.  Lymphadenopathy:    She has no cervical adenopathy.  Neurological: She is alert. She is not disoriented. Coordination and gait normal. GCS eye subscore is 4. GCS verbal subscore is 5. GCS motor subscore is 6.  Skin: Skin is warm and dry. She is not diaphoretic.  Psychiatric: She has a normal mood and affect. Her behavior is normal.  Nursing note and vitals reviewed.    ED Treatments / Results  DIAGNOSTIC STUDIES: Oxygen Saturation is 100% on RA, normal by my interpretation.  COORDINATION OF CARE:  12:53 AM Discussed treatment plan with pt at bedside and pt agreed to plan.  Labs (all labs ordered are listed, but only abnormal results are displayed) Labs Reviewed  COMPREHENSIVE METABOLIC PANEL - Abnormal; Notable for the following:  Result Value   Potassium 3.3 (*)    Total Protein 8.6 (*)    Albumin 5.1 (*)    All other components within normal limits  ACETAMINOPHEN LEVEL - Abnormal; Notable for the following:    Acetaminophen (Tylenol), Serum <10 (*)    All other components within normal limits  CBC - Abnormal; Notable for the following:    WBC 11.0 (*)    RBC 5.21 (*)    Hemoglobin 15.8 (*)    All other components within normal limits  RAPID URINE DRUG SCREEN, HOSP PERFORMED - Abnormal; Notable for the following:    Tetrahydrocannabinol POSITIVE (*)    All other components within normal limits  ETHANOL  SALICYLATE LEVEL  HCG, QUANTITATIVE, PREGNANCY  URINALYSIS, ROUTINE W REFLEX MICROSCOPIC (NOT AT [facility])    EKG  EKG Interpretation None       Radiology No results found.  Procedures Procedures (including critical care time)  Medications Ordered in ED Medications  alum & mag hydroxide-simeth (MAALOX/MYLANTA) 200-200-20 MG/5ML suspension 30 mL (not administered)  ondansetron (ZOFRAN) tablet 4 mg (not  administered)  nicotine (NICODERM CQ - dosed in mg/24 hours) patch 21 mg (not administered)  zolpidem (AMBIEN) tablet 5 mg (not administered)  ibuprofen (ADVIL,MOTRIN) tablet 600 mg (not administered)  acetaminophen (TYLENOL) tablet 650 mg (not administered)  LORazepam (ATIVAN) tablet 1 mg (1 mg Oral Given 01/01/16 0417)     Initial Impression / Assessment and Plan / ED Course  I have reviewed the triage vital signs and the nursing notes.  Pertinent labs & imaging results that were available during my care of the patient were reviewed by me and considered in my medical decision making (see chart for details).  Clinical Course    Patient presents to ED with complaint of auditory hallucinations x 2 years that have progressively worsened. She also endorses SI and self harm behavior. Patient is afebrile and non-toxic appearing in NAD. VSS. Anisocoria noted with L 1mm > R, baseline. Review of records indicate similar findings in 2016. Lungs CTABL. Heart RRR. Abdomen soft and non-tender. Will check labs and TTS consult.   Mild elevation in WBC - may be re-active; labs otherwise grossly normal. Patient is medically cleared. Per TTS consult, patient meets inpatient criteria.   When told patient she meets criteria for inpatient treatment she became upset and agitated. She declined to stay, stating she wanted outpatient treatment. States she needs to go home to "be with her kids" and that her "husband has to go to work and get his check." Given patient's negative auditory hallucinations and self-injury behavior concern patient is risk to herself. Patient has been IVC'd.   Final Clinical Impressions(s) / ED Diagnoses   Final diagnoses:  Auditory hallucination  Suicidal ideation  Medical clearance for psychiatric admission   I personally performed the services described in this documentation, which was scribed in my presence. The recorded information has been reviewed and is accurate.  New  Prescriptions New Prescriptions   No medications on file     Lona Kettle, PA-C 01/01/16 0223    Lona Kettle, PA-C 01/01/16 [id number]    Tomasita Crumble, MD 01/01/16 [phone]

## 2016-01-01 NOTE — ED Notes (Signed)
Pt becoming more agitated at this time. Provider at bedside to discuss plan of care. Pt upset about length of stay at this time. Provider to place pt under IVC due to pt stating previously that she had thoughts of harming herself.

## 2016-01-01 NOTE — Consult Note (Signed)
[facility] Consult   Reason for Consult:  Depression, hallucinations Referring Physician:  EDP Patient Identification: DEIJAH SPIKES MRN:  [id number] Principal Diagnosis: Major depressive disorder, recurrent episode, moderate ([place]) Diagnosis:   Patient Active Problem List   Diagnosis Date Noted  . Major depressive disorder, recurrent episode, moderate ([place]) [F33.1] 01/01/2016    Priority: High    Total Time spent with patient: 45 minutes  Subjective:   CYRENA KUCHENBECKER is a 45 y.o. female patient presented to the ED with depression and hallucinations.  HPI:  45 yo who recently moved to [facility] and came to the ED for assistance with her depression and hallucinations.  She has been hearing negative voices for the past few years but appears to be more negative self-talk.  Yamilex would like to start an antidepressant and get referrals to outpatient for her care.  Denies suicidal/homicidal ideations, hallucinations, and alcohol/drug abuse.  Pleasant, clear, coherent.  Lives with her husband and three children, no safety concerns.  Stable for discharge.  Past Psychiatric History: depression  Risk to Self: Suicidal Ideation: Yes-Currently Present Suicidal Intent: Yes-Currently Present Is patient at risk for suicide?: Yes Suicidal Plan?: No What has been your use of drugs/alcohol within the last 12 months?: alcohol How many times?: 2 (at least once in 9th grade, last week) Triggers for Past Attempts: Unknown Intentional Self Injurious Behavior: None Risk to Others: Homicidal Ideation: No Thoughts of Harm to Others: No Current Homicidal Intent: No Current Homicidal Plan: No Access to Homicidal Means: No History of harm to others?: No Assessment of Violence: None Noted Does patient have access to weapons?: No Criminal Charges Pending?: No Does patient have a court date: No Prior Inpatient Therapy: Prior Inpatient Therapy: Yes Prior Therapy Dates: 45 yrs old Prior  Therapy Facilty/Provider(s): [facility] Reason for Treatment: depression Prior Outpatient Therapy: Prior Outpatient Therapy: Yes Prior Therapy Dates: unknown  Reason for Treatment: depression Does patient have an ACCT team?: No Does patient have Intensive In-House Services?  : No Does patient have [facility] services? : No Does patient have P4CC services?: No  Past Medical History:  Past Medical History:  Diagnosis Date  . Abortion    x2  . Family history of anesthesia complication    " my son has malignant hypothermia "  . Preterm labor   . Urinary tract infection     Past Surgical History:  Procedure Laterality Date  . INDUCED ABORTION     x2  . KNEE CARTILAGE SURGERY  1/08  . ORIF ANKLE FRACTURE Right 08/28/2012   Dr Sharol Given  . ORIF ANKLE FRACTURE Right 08/28/2012   Procedure: OPEN REDUCTION INTERNAL FIXATION (ORIF) RIGHT TRIMALEOLAR  ANKLE FRACTURE;  Surgeon: Newt Minion, MD;  Location: [place];  Service: Orthopedics;  Laterality: Right;  . TUBAL LIGATION Bilateral 05/18/2012   Procedure: POST PARTUM TUBAL LIGATION;  Surgeon: Woodroe Mode, MD;  Location: [facility];  Service: Gynecology;  Laterality: Bilateral;   Family History:  Family History  Problem Relation Age of Onset  . Cancer Other     Breast-maternal side   Family Psychiatric  History: none Social History:  History  Alcohol Use  . 0.6 oz/week  . 1 Cans of beer per week    Comment: "1 beer a week for constipation" per patient-in early pregnancy     History  Drug Use No    Social History   Social History  . Marital status: Divorced    Spouse name: N/A  .  Number of children: N/A  . Years of education: N/A   Social History Main Topics  . Smoking status: Current Some Day Smoker    Packs/day: 0.50    Years: 26.00    Types: Cigarettes  . Smokeless tobacco: Never Used  . Alcohol use 0.6 oz/week    1 Cans of beer per week     Comment: "1 beer a week for constipation" per patient-in early pregnancy  . Drug  use: No  . Sexual activity: Yes    Birth control/ protection: None   Other Topics Concern  . None   Social History Narrative  . None   Additional Social History:    Allergies:  No Known Allergies  Labs:  Results for orders placed or performed during the hospital encounter of 12/31/15 (from the past 48 hour(s))  Rapid urine drug screen (hospital performed)     Status: Abnormal   Collection Time: 12/31/15  9:29 PM  Result Value Ref Range   Opiates NONE DETECTED NONE DETECTED   Cocaine NONE DETECTED NONE DETECTED   Benzodiazepines NONE DETECTED NONE DETECTED   Amphetamines NONE DETECTED NONE DETECTED   Tetrahydrocannabinol POSITIVE (A) NONE DETECTED   Barbiturates NONE DETECTED NONE DETECTED    Comment:        DRUG SCREEN FOR MEDICAL PURPOSES ONLY.  IF CONFIRMATION IS NEEDED FOR ANY PURPOSE, NOTIFY LAB WITHIN 5 DAYS.        LOWEST DETECTABLE LIMITS FOR URINE DRUG SCREEN Drug Class       Cutoff (ng/mL) Amphetamine      1000 Barbiturate      200 Benzodiazepine   256 Tricyclics       389 Opiates          300 Cocaine          300 THC              50   Urinalysis, Routine w reflex microscopic     Status: Abnormal   Collection Time: 12/31/15  9:29 PM  Result Value Ref Range   Color, Urine YELLOW YELLOW   APPearance CLOUDY (A) CLEAR   Specific Gravity, Urine 1.022 1.005 - 1.030   pH 6.0 5.0 - 8.0   Glucose, UA NEGATIVE NEGATIVE mg/dL   Hgb urine dipstick NEGATIVE NEGATIVE   Bilirubin Urine NEGATIVE NEGATIVE   Ketones, ur NEGATIVE NEGATIVE mg/dL   Protein, ur NEGATIVE NEGATIVE mg/dL   Nitrite NEGATIVE NEGATIVE   Leukocytes, UA NEGATIVE NEGATIVE    Comment: MICROSCOPIC NOT DONE ON URINES WITH NEGATIVE PROTEIN, BLOOD, LEUKOCYTES, NITRITE, OR GLUCOSE <1000 mg/dL.  Comprehensive metabolic panel     Status: Abnormal   Collection Time: 12/31/15  9:31 PM  Result Value Ref Range   Sodium 137 135 - 145 mmol/L   Potassium 3.3 (L) 3.5 - 5.1 mmol/L   Chloride 110 101 - 111  mmol/L   CO2 22 22 - 32 mmol/L   Glucose, Bld 91 65 - 99 mg/dL   BUN 8 6 - 20 mg/dL   Creatinine, Ser 0.77 0.44 - 1.00 mg/dL   Calcium 9.3 8.9 - 10.3 mg/dL   Total Protein 8.6 (H) 6.5 - 8.1 g/dL   Albumin 5.1 (H) 3.5 - 5.0 g/dL   AST 21 15 - 41 U/L   ALT 18 14 - 54 U/L   Alkaline Phosphatase 56 38 - 126 U/L   Total Bilirubin 0.9 0.3 - 1.2 mg/dL   GFR calc non Af Amer >60 >60 mL/min  GFR calc Af Amer >60 >60 mL/min    Comment: (NOTE) The eGFR has been calculated using the CKD EPI equation. This calculation has not been validated in all clinical situations. eGFR's persistently <60 mL/min signify possible Chronic Kidney Disease.    Anion gap 5 5 - 15  Ethanol     Status: None   Collection Time: 12/31/15  9:31 PM  Result Value Ref Range   Alcohol, Ethyl (B) <5 <5 mg/dL    Comment:        LOWEST DETECTABLE LIMIT FOR SERUM ALCOHOL IS 5 mg/dL FOR MEDICAL PURPOSES ONLY   Salicylate level     Status: None   Collection Time: 12/31/15  9:31 PM  Result Value Ref Range   Salicylate Lvl <9.7 2.8 - 30.0 mg/dL  Acetaminophen level     Status: Abnormal   Collection Time: 12/31/15  9:31 PM  Result Value Ref Range   Acetaminophen (Tylenol), Serum <10 (L) 10 - 30 ug/mL    Comment:        THERAPEUTIC CONCENTRATIONS VARY SIGNIFICANTLY. A RANGE OF 10-30 ug/mL MAY BE AN EFFECTIVE CONCENTRATION FOR MANY PATIENTS. HOWEVER, SOME ARE BEST TREATED AT CONCENTRATIONS OUTSIDE THIS RANGE. ACETAMINOPHEN CONCENTRATIONS >150 ug/mL AT 4 HOURS AFTER INGESTION AND >50 ug/mL AT 12 HOURS AFTER INGESTION ARE OFTEN ASSOCIATED WITH TOXIC REACTIONS.   cbc     Status: Abnormal   Collection Time: 12/31/15  9:31 PM  Result Value Ref Range   WBC 11.0 (H) 4.0 - 10.5 K/uL   RBC 5.21 (H) 3.87 - 5.11 MIL/uL   Hemoglobin 15.8 (H) 12.0 - 15.0 g/dL   HCT 44.7 36.0 - 46.0 %   MCV 85.8 78.0 - 100.0 fL   MCH 30.3 26.0 - 34.0 pg   MCHC 35.3 30.0 - 36.0 g/dL   RDW 15.2 11.5 - 15.5 %   Platelets 233 150 - 400  K/uL  hCG, quantitative, pregnancy     Status: None   Collection Time: 12/31/15  9:32 PM  Result Value Ref Range   hCG, Beta Chain, Quant, S <1 <5 mIU/mL    Comment:          GEST. AGE      CONC.  (mIU/mL)   <=1 WEEK        5 - 50     2 WEEKS       50 - 500     3 WEEKS       100 - 10,000     4 WEEKS     1,000 - 30,000     5 WEEKS     3,500 - 115,000   6-8 WEEKS     12,000 - 270,000    12 WEEKS     15,000 - 220,000        FEMALE AND NON-PREGNANT FEMALE:     LESS THAN 5 mIU/mL     Current Facility-Administered Medications  Medication Dose Route Frequency Provider Last Rate Last Dose  . acetaminophen (TYLENOL) tablet 650 mg  650 mg Oral Q4H PRN Roxanna Mew, PA-C      . alum & mag hydroxide-simeth (MAALOX/MYLANTA) 200-200-20 MG/5ML suspension 30 mL  30 mL Oral PRN Roxanna Mew, PA-C      . ibuprofen (ADVIL,MOTRIN) tablet 600 mg  600 mg Oral Q8H PRN Roxanna Mew, PA-C      . nicotine (NICODERM CQ - dosed in mg/24 hours) patch 21 mg  21 mg Transdermal Daily Roxanna Mew,  PA-C      . ondansetron (ZOFRAN) tablet 4 mg  4 mg Oral Q8H PRN Roxanna Mew, PA-C      . zolpidem [facility]) tablet 5 mg  5 mg Oral QHS PRN Roxanna Mew, PA-C       Current Outpatient Prescriptions  Medication Sig Dispense Refill  . acetaminophen (TYLENOL) 500 MG tablet Take 500 mg by mouth as needed for pain.    Marland Kitchen ibuprofen (ADVIL,MOTRIN) 200 MG tablet Take 200 mg by mouth every 6 (six) hours as needed for moderate pain.    . clindamycin (CLEOCIN) 300 MG capsule Take 1 capsule (300 mg total) by mouth 3 (three) times daily. (Patient not taking: Reported on 12/31/2015) 30 capsule 0  . HYDROcodone-acetaminophen (NORCO) 5-325 MG per tablet Take 1 tablet by mouth every 6 (six) hours as needed for pain. (Patient not taking: Reported on 01/19/2015) 60 tablet 0  . HYDROcodone-acetaminophen (NORCO/VICODIN) 5-325 MG tablet Take 2 tablets by mouth every 4 (four) hours as needed. (Patient not  taking: Reported on 12/31/2015) 20 tablet 0    Musculoskeletal: Strength & Muscle Tone: within normal limits Gait & Station: normal Patient leans: N/A  Psychiatric Specialty Exam: Physical Exam  Constitutional: She is oriented to person, place, and time. She appears well-developed and well-nourished.  HENT:  Head: Normocephalic.  Neck: Normal range of motion.  Respiratory: Effort normal.  Musculoskeletal: Normal range of motion.  Neurological: She is alert and oriented to person, place, and time.  Skin: Skin is warm and dry.  Psychiatric: Her speech is normal. Judgment and thought content normal. She is actively hallucinating. Cognition and memory are normal. She exhibits a depressed mood.    Review of Systems  Constitutional: Negative.   HENT: Negative.   Eyes: Negative.   Respiratory: Negative.   Cardiovascular: Negative.   Gastrointestinal: Negative.   Genitourinary: Negative.   Musculoskeletal: Negative.   Skin: Negative.   Neurological: Negative.   Endo/Heme/Allergies: Negative.   Psychiatric/Behavioral: Positive for depression and hallucinations.    Blood pressure 125/71, pulse 67, temperature 97.7 F (36.5 C), temperature source Oral, resp. rate 18, height '5\' 3"'  (1.6 m), weight 66.7 kg (147 lb), SpO2 99 %.Body mass index is 26.04 kg/m.  General Appearance: Casual  Eye Contact:  Good  Speech:  Normal Rate  Volume:  Normal  Mood:  Depressed  Affect:  Congruent  Thought Process:  Coherent and Descriptions of Associations: Intact  Orientation:  Full (Time, Place, and Person)  Thought Content:  Hallucinations: Auditory  Suicidal Thoughts:  No  Homicidal Thoughts:  No  Memory:  Immediate;   Good Recent;   Good Remote;   Good  Judgement:  Fair  Insight:  Fair  Psychomotor Activity:  Normal  Concentration:  Concentration: Good and Attention Span: Good  Recall:  Good  Fund of Knowledge:  Fair  Language:  Good  Akathisia:  No  Handed:  Right  AIMS (if  indicated):     Assets:  Leisure Time Physical Health Resilience Social Support  ADL's:  Intact  Cognition:  WNL  Sleep:        Treatment Plan Summary: Daily contact with patient to assess and evaluate symptoms and progress in treatment, Medication management and Plan major depressive disorder, recurrent, moderate with psychosis:  -Crisis stabilization -Medication management:  Started Prozac 20 mg daily for depression and Vistaril 25 mg every six hours PRN anxiety -Individual counseling  Disposition: No evidence of imminent risk to self or others at present.  Waylan Boga, NP 01/01/2016 11:11 AM  Patient seen face-to-face for psychiatric evaluation, chart reviewed and case discussed with the physician extender and developed treatment plan. Reviewed the information documented and agree with the treatment plan. Corena Pilgrim, MD

## 2016-01-01 NOTE — BH Assessment (Signed)
[facility] Assessment Progress Note  Per Thedore MinsMojeed Akintayo, MD, this pt does not require psychiatric hospitalization at this time.  Pt [facility] under [facility], which Dr Jannifer FranklinAkintayo has rescinded.  [facility] with [facility]errals for psychiatry and therapy.  Discharge instructions advise [facility], or [facility].  Pt's nurse, Kendal Hymendie, has been notified.  Deborah Canninghomas Daryl Quiros, MA Triage Specialist [phone]

## 2016-01-01 NOTE — ED Notes (Signed)
Bed: WBH36 Expected date:  Expected time:  Means of arrival:  Comments: Triage 4 

## 2016-01-01 NOTE — ED Notes (Signed)
Pt discharged ambulatory.  Discharge instructions and prescriptions were reviewed.  Pt verbalized understanding.  Pt was calm and cooperative.

## 2016-01-01 NOTE — ED Notes (Addendum)
Patient A/O, no noted distress or pain. Patient is irritable, she notes "I was lied to. I went over to talk to my neighbor about what I was going through. She brought me to the hospital to talk to someone. I was told, I will be able to go home. I don't work, I keep my children at home and my house works during the day. We are living pay check to pay check, barely making. Now, my husband will not be able to go to work in the morning because I am here." Patient admits "hearing voices." She denies signing any documents, but verbal agrees to all belongings are listed. At this time she does not want to add, anyone of list to call or visit. She is tearful and ask for something for her "nerves to help calm down." See MAR, administered prn. Patient denies any review system abnormalities. Does not take any meds. Gait is steady. Unable to fully assess. Smokes a pack and a half a day.

## 2016-01-01 NOTE — BH Assessment (Signed)
Tele Assessment Note   Deborah Arnold is an 45 y.o. female presenting to [facility] with her neighbor who encouraged her to come for an assessment. Patient admits to hearing voices for over a year telling her to kill herself. Patient was actively responding to internal stimuli in assessment. Patient made an attempt to kill herself a week ago by cutting wrist. Patient showed this clinician superficial cuts on her left wrist. She denied any history of cutting behavior.   Patient reports decreased sleep, inability to concentrate, depression, panic attack weekly. Patient reported that she is concerned she will act on thoughts to take her life.   Patient has a history of depression with a previous SI attempt and admission to [facility]. Patient was on medication until about 6 yrs ago but stopped after a pregnancy and has not restarted medication.  Patient also verbalized delusional thinking that others were talking about her. Denies Hi. According to patients neighbor she became aggressive last night due to auditory hallucinations. When her daughter, age 65 yr old attempted to calm her down she elbowed the child.   Patient reports having 3 children in the home ages, 65, 10 and 19 yrs old.   Patient reports a history of regular alcohol use until 1.5 yrs ago when she quit. Recently relapsed after failing a drivers license test.    Elta Guadeloupe, NP recommends inpatient treatment.    Diagnosis: Major Depressive Disorder, recurrent severe, with psychosis; Anxiety Disorder   Past Medical History:  Past Medical History:  Diagnosis Date  . Abortion    x2  . Family history of anesthesia complication    " my son has malignant hypothermia "  . Preterm labor   . Urinary tract infection     Past Surgical History:  Procedure Laterality Date  . INDUCED ABORTION     x2  . KNEE CARTILAGE SURGERY  1/08  . ORIF ANKLE FRACTURE Right 08/28/2012   Dr Lajoyce Corners  . ORIF ANKLE FRACTURE Right 08/28/2012   Procedure:  OPEN REDUCTION INTERNAL FIXATION (ORIF) RIGHT TRIMALEOLAR  ANKLE FRACTURE;  Surgeon: Nadara Mustard, MD;  Location: [facility];  Service: Orthopedics;  Laterality: Right;  . TUBAL LIGATION Bilateral 05/18/2012   Procedure: POST PARTUM TUBAL LIGATION;  Surgeon: Adam Phenix, MD;  Location: [facility];  Service: Gynecology;  Laterality: Bilateral;    Family History:  Family History  Problem Relation Age of Onset  . Cancer Other     Breast-maternal side    Social History:  reports that she has been smoking Cigarettes.  She has a 13.00 pack-year smoking history. She has never used smokeless tobacco. She reports that she drinks about 0.6 oz of alcohol per week . She reports that she does not use drugs.  Additional Social History:  Alcohol / Drug Use Pain Medications: see MAR Prescriptions: see MAR Over the Counter: see MAR History of alcohol / drug use?: Yes Substance #1 Name of Substance 1: alcohol 1 - Frequency: drank a few beers, had been sober over a year 1 - Last Use / Amount: last week  CIWA: CIWA-Ar BP: 128/97 Pulse Rate: 63 COWS:    PATIENT STRENGTHS: (choose at least two) Average or above average intelligence Supportive family/friends  Allergies: No Known Allergies  Home Medications:  (Not in a hospital admission)  OB/GYN Status:  No LMP recorded.  General Assessment Data Location of Assessment: [facility] TTS Assessment: In system Is this a Tele or Face-to-Face Assessment?: Face-to-Face Is  this an Initial Assessment or a Re-assessment for this encounter?: Initial Assessment Marital status: Married Is patient pregnant?: No Pregnancy Status: No Living Arrangements: Spouse/significant other Can pt return to current living arrangement?: Yes Admission Status:  (unknown) Is patient capable of signing voluntary admission?: No Referral Source: Self/Family/Friend Insurance type: MCD  Medical Screening Exam [facility] Walk-in ONLY) Medical Exam completed:  Yes  Crisis Care Plan Living Arrangements: Spouse/significant other Name of Psychiatrist: n/a Name of Therapist: n/a     Risk to self with the past 6 months Suicidal Ideation: Yes-Currently Present Has patient been a risk to self within the past 6 months prior to admission? : Yes Suicidal Intent: Yes-Currently Present Has patient had any suicidal intent within the past 6 months prior to admission? : Yes Is patient at risk for suicide?: Yes Suicidal Plan?: No Has patient had any suicidal plan within the past 6 months prior to admission? : No What has been your use of drugs/alcohol within the last 12 months?: alcohol Previous Attempts/Gestures: Yes How many times?: 2 (at least once in 9th grade, last week) Triggers for Past Attempts: Unknown Intentional Self Injurious Behavior: None Family Suicide History: No Recent stressful life event(s):  (mental health issues) Persecutory voices/beliefs?: Yes Depression: Yes Depression Symptoms: Feeling angry/irritable Substance abuse history and/or treatment for substance abuse?: Yes Suicide prevention information given to non-admitted patients: Not applicable  Risk to Others within the past 6 months Homicidal Ideation: No Does patient have any lifetime risk of violence toward others beyond the six months prior to admission? : No Thoughts of Harm to Others: No Current Homicidal Intent: No Current Homicidal Plan: No Access to Homicidal Means: No History of harm to others?: No Assessment of Violence: None Noted Does patient have access to weapons?: No Criminal Charges Pending?: No Does patient have a court date: No Is patient on probation?: No  Psychosis Hallucinations: Auditory (voices telling her to kill herself) Delusions:  (believes  others are talking about her)  Mental Status Report Appearance/Hygiene: Unremarkable Eye Contact: Poor Motor Activity: Unremarkable Speech: Logical/coherent Level of Consciousness: Alert Mood:  Depressed, Suspicious Affect: Blunted, Preoccupied Anxiety Level: Minimal Thought Processes: Thought Blocking Judgement: Impaired Orientation: Person, Place, Time, Situation  Cognitive Functioning Concentration: Decreased Memory: Recent Intact, Remote Intact IQ: Average Insight: Poor Impulse Control: Poor Appetite: Fair Sleep: Decreased  ADLScreening Procedure [facility] Services) Patient's cognitive ability adequate to safely complete daily activities?: Yes Patient able to express need for assistance with ADLs?: Yes Independently performs ADLs?: Yes (appropriate for developmental age)  Prior Inpatient Therapy Prior Inpatient Therapy: Yes Prior Therapy Dates: 45 yrs old Prior Therapy Facilty/Provider([facility]): [facility] Reason for Treatment: depression  Prior Outpatient Therapy Prior Outpatient Therapy: Yes Prior Therapy Dates: unknown  Reason for Treatment: depression Does patient have an ACCT team?: No Does patient have Intensive In-House Services?  : No Does patient have [facility] services? : No Does patient have P4CC services?: No  ADL Screening (condition at time of admission) Patient's cognitive ability adequate to safely complete daily activities?: Yes Is the patient deaf or have difficulty hearing?: No Does the patient have difficulty seeing, even when wearing glasses/contacts?: No Does the patient have difficulty concentrating, remembering, or making decisions?: No Patient able to express need for assistance with ADLs?: Yes Does the patient have difficulty dressing or bathing?: No Independently performs ADLs?: Yes (appropriate for developmental age)       Abuse/Neglect Assessment (Assessment to be complete while patient is alone) Physical Abuse: Denies Verbal Abuse: Denies Sexual  Abuse: Denies     Advance Directives (For Healthcare) Does patient have an advance directive?: No    Additional Information 1:1 In Past 12 Months?: No CIRT Risk: No Elopement Risk:  No Does patient have medical clearance?: No     Disposition:  Disposition Initial Assessment Completed for this Encounter: Yes Disposition of Patient: Inpatient treatment program Elta Guadeloupe(Laurie Parks, NP recommends inpatient) Type of inpatient treatment program: Adult  Deborah Arnold 01/01/2016 12:32 AM

## 2016-01-01 NOTE — ED Notes (Signed)
Ativan effective

## 2016-01-02 NOTE — [facility] Suicide Risk Assessment (Signed)
Suicide Risk Assessment  Discharge Assessment   [facility]   Principal Problem: Major depressive disorder, recurrent episode, moderate (HCC) Discharge Diagnoses:  Patient Active Problem List   Diagnosis Date Noted  . Major depressive disorder, recurrent episode, moderate (HCC) [F33.1] 01/01/2016    Priority: High  . Auditory hallucination [R44.0]     Total Time spent with patient: 45 minutes  Musculoskeletal: Strength & Muscle Tone: within normal limits Gait & Station: normal Patient leans: N/A  Psychiatric Specialty Exam: Physical Exam  Constitutional: She is oriented to person, place, and time. She appears well-developed and well-nourished.  HENT:  Head: Normocephalic.  Neck: Normal range of motion.  Respiratory: Effort normal.  Musculoskeletal: Normal range of motion.  Neurological: She is alert and oriented to person, place, and time.  Skin: Skin is warm and dry.  Psychiatric: Her speech is normal. Judgment and thought content normal. She is actively hallucinating. Cognition and memory are normal. She exhibits a depressed mood.    Review of Systems  Constitutional: Negative.   HENT: Negative.   Eyes: Negative.   Respiratory: Negative.   Cardiovascular: Negative.   Gastrointestinal: Negative.   Genitourinary: Negative.   Musculoskeletal: Negative.   Skin: Negative.   Neurological: Negative.   Endo/Heme/Allergies: Negative.   Psychiatric/Behavioral: Positive for depression and hallucinations.    Blood pressure 125/71, pulse 67, temperature 97.7 F (36.5 C), temperature source Oral, resp. rate 18, height 5\' 3"  (1.6 m), weight 66.7 kg (147 lb), SpO2 99 %.Body mass index is 26.04 kg/m.  General Appearance: Casual  Eye Contact:  Good  Speech:  Normal Rate  Volume:  Normal  Mood:  Depressed  Affect:  Congruent  Thought Process:  Coherent and Descriptions of Associations: Intact  Orientation:  Full (Time, Place, and Person)  Thought  Content:  Hallucinations: Auditory  Suicidal Thoughts:  No  Homicidal Thoughts:  No  Memory:  Immediate;   Good Recent;   Good Remote;   Good  Judgement:  Fair  Insight:  Fair  Psychomotor Activity:  Normal  Concentration:  Concentration: Good and Attention Span: Good  Recall:  Good  Fund of Knowledge:  Fair  Language:  Good  Akathisia:  No  Handed:  Right  AIMS (if indicated):     Assets:  Leisure Time Physical Health Resilience Social Support  ADL's:  Intact  Cognition:  WNL  Sleep:       Mental Status Per Nursing Assessment::   On Admission:   hallucinations and depression  Demographic Factors:  Female and Caucasian  Loss Factors: NA  Historical Factors: NA  Risk Reduction Factors:   Responsible for children under 46 years of age, Sense of responsibility to family, Living with another person, especially a relative and Positive social support  Continued Clinical Symptoms:  Depression, mild  Cognitive Features That Contribute To Risk:  None    Suicide Risk:  Minimal: No identifiable suicidal ideation.  Patients presenting with no risk factors but with morbid ruminations; may be classified as minimal risk based on the severity of the depressive symptoms    Plan Of Care/Follow-up recommendations:  Activity:  as tolerated Diet:  heart healthy diet  Ercell Razon, NP 01/02/2016, 9:26 AM

## 2018-03-08 ENCOUNTER — Telehealth (INDEPENDENT_AMBULATORY_CARE_PROVIDER_SITE_OTHER): Payer: Self-pay | Admitting: Orthopedic Surgery

## 2018-03-08 NOTE — Telephone Encounter (Signed)
Mailed records release form to patient

## 2018-08-21 ENCOUNTER — Other Ambulatory Visit: Payer: Self-pay

## 2018-08-21 ENCOUNTER — Encounter (HOSPITAL_COMMUNITY): Payer: Self-pay

## 2018-08-21 ENCOUNTER — Inpatient Hospital Stay (HOSPITAL_COMMUNITY)
Admission: RE | Admit: 2018-08-21 | Discharge: 2018-08-27 | DRG: 885 | Disposition: A | Discharge status: Home / Self Care | Payer: Medicaid Other | Attending: Psychiatry | Admitting: Psychiatry

## 2018-08-21 DIAGNOSIS — F411 Generalized anxiety disorder: Secondary | ICD-10-CM | POA: Diagnosis present

## 2018-08-21 DIAGNOSIS — F250 Schizoaffective disorder, bipolar type: Secondary | ICD-10-CM | POA: Diagnosis present

## 2018-08-21 DIAGNOSIS — R45851 Suicidal ideations: Secondary | ICD-10-CM | POA: Diagnosis present

## 2018-08-21 DIAGNOSIS — F259 Schizoaffective disorder, unspecified: Secondary | ICD-10-CM | POA: Diagnosis present

## 2018-08-21 DIAGNOSIS — F319 Bipolar disorder, unspecified: Principal | ICD-10-CM | POA: Diagnosis present

## 2018-08-21 DIAGNOSIS — G4700 Insomnia, unspecified: Secondary | ICD-10-CM | POA: Diagnosis present

## 2018-08-21 DIAGNOSIS — F17210 Nicotine dependence, cigarettes, uncomplicated: Secondary | ICD-10-CM | POA: Diagnosis present

## 2018-08-21 DIAGNOSIS — Z1159 Encounter for screening for other viral diseases: Secondary | ICD-10-CM

## 2018-08-21 MED ORDER — TRAZODONE HCL 50 MG PO TABS
50.0000 mg | ORAL_TABLET | Freq: Every evening | ORAL | Status: DC | PRN
  Administered 2018-08-21: 22:00:00 50 mg via ORAL
  Filled 2018-08-21 (×2): qty 1

## 2018-08-21 MED ORDER — HYDROXYZINE HCL 25 MG PO TABS
25.0000 mg | ORAL_TABLET | Freq: Four times a day (QID) | ORAL | Status: DC | PRN
  Administered 2018-08-21 – 2018-08-23 (×2): 25 mg via ORAL
  Filled 2018-08-21 (×3): qty 1

## 2018-08-21 MED ORDER — MAGNESIUM HYDROXIDE 400 MG/5ML PO SUSP: 30.0000 mL | Freq: Every day | ORAL | Status: DC | PRN

## 2018-08-21 MED ORDER — ALUM & MAG HYDROXIDE-SIMETH 200-200-20 MG/5ML PO SUSP: 30.0000 mL | ORAL | Status: DC | PRN

## 2018-08-21 MED ORDER — ACETAMINOPHEN 325 MG PO TABS: 650.0000 mg | ORAL_TABLET | Freq: Four times a day (QID) | ORAL | Status: DC | PRN
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
## 2018-08-21 NOTE — Plan of Care (Signed)
[place] Observation Crisis Plan  Reason for Crisis Plan:  Crisis Stabilization   Plan of Care:  Referral for Telepsychiatry/Psychiatric Consult  Family Support:      Current Living Environment:  Living Arrangements: Spouse/significant other, Children  Insurance:   Hospital Account    Name Acct ID Class Status Primary Coverage   Deborah Arnold, Deborah Arnold [id number] [place]        Guarantor Account (for Hospital Account [id number])    Name Relation to Pt Service Area Active? Acct Type   Deborah Arnold Self [facility] Yes Behavioral Health   Address Phone       [address] [place], [place] [address] [phone])          Coverage Information (for Hospital Account [id number])    F/O Payor/Plan Precert #   [facility] MEDICAID/[place] MEDICAID    Subscriber Subscriber #   Deborah Arnold, Deborah Arnold [id number]   Address Phone   [address] [place], [place] [address] [phone]      Legal Guardian:  Legal Guardian: Other:(Self)  Primary Care Provider:  Susy Frizzle, MD  Current Outpatient Providers:  Jinny Blossom  Psychiatrist:  Name of Psychiatrist: None  Counselor/Therapist:  Name of Therapist: None  Compliant with Medications:  No  Additional Information:   Deborah Arnold 6/23/202010:45 PM

## 2018-08-22 DIAGNOSIS — G4700 Insomnia, unspecified: Secondary | ICD-10-CM | POA: Diagnosis present

## 2018-08-22 DIAGNOSIS — R45851 Suicidal ideations: Secondary | ICD-10-CM | POA: Diagnosis present

## 2018-08-22 DIAGNOSIS — F3164 Bipolar disorder, current episode mixed, severe, with psychotic features: Secondary | ICD-10-CM | POA: Diagnosis not present

## 2018-08-22 DIAGNOSIS — F17210 Nicotine dependence, cigarettes, uncomplicated: Secondary | ICD-10-CM | POA: Diagnosis present

## 2018-08-22 DIAGNOSIS — F319 Bipolar disorder, unspecified: Secondary | ICD-10-CM | POA: Diagnosis present

## 2018-08-22 DIAGNOSIS — F259 Schizoaffective disorder, unspecified: Secondary | ICD-10-CM | POA: Diagnosis present

## 2018-08-22 DIAGNOSIS — Z1159 Encounter for screening for other viral diseases: Secondary | ICD-10-CM | POA: Diagnosis not present

## 2018-08-22 DIAGNOSIS — F411 Generalized anxiety disorder: Secondary | ICD-10-CM | POA: Diagnosis present

## 2018-08-22 LAB — RAPID URINE DRUG SCREEN, HOSP PERFORMED
Amphetamines: NOT DETECTED
Barbiturates: NOT DETECTED
Benzodiazepines: NOT DETECTED
Cocaine: NOT DETECTED
Opiates: NOT DETECTED
Tetrahydrocannabinol: POSITIVE — AB

## 2018-08-22 LAB — COMPREHENSIVE METABOLIC PANEL
ALT: 18 U/L (ref 0–44)
AST: 20 U/L (ref 15–41)
Albumin: 4.2 g/dL (ref 3.5–5.0)
Alkaline Phosphatase: 52 U/L (ref 38–126)
Anion gap: 11 (ref 5–15)
BUN: 6 mg/dL (ref 6–20)
CO2: 19 mmol/L — ABNORMAL LOW (ref 22–32)
Calcium: 8.8 mg/dL — ABNORMAL LOW (ref 8.9–10.3)
Chloride: 107 mmol/L (ref 98–111)
Creatinine, Ser: 0.75 mg/dL (ref 0.44–1.00)
GFR calc Af Amer: >60 mL/min (ref >60)
GFR calc non Af Amer: >60 mL/min (ref >60)
Glucose, Bld: 95 mg/dL (ref 70–99)
Potassium: 3.5 mmol/L (ref 3.5–5.1)
Sodium: 137 mmol/L (ref 135–145)
Total Bilirubin: 0.4 mg/dL (ref 0.3–1.2)
Total Protein: 7.2 g/dL (ref 6.5–8.1)

## 2018-08-22 LAB — CBC
HCT: 43.3 % (ref 36.0–46.0)
Hemoglobin: 13.9 g/dL (ref 12.0–15.0)
MCH: 28.5 pg (ref 26.0–34.0)
MCHC: 32.1 g/dL (ref 30.0–36.0)
MCV: 88.7 fL (ref 80.0–100.0)
Platelets: 263 10*3/uL (ref 150–400)
RBC: 4.88 MIL/uL (ref 3.87–5.11)
RDW: 15.4 % (ref 11.5–15.5)
WBC: 7.4 10*3/uL (ref 4.0–10.5)
nRBC: 0 % (ref 0.0–0.2)

## 2018-08-22 LAB — SARS CORONAVIRUS 2 BY RT PCR (HOSPITAL ORDER, PERFORMED IN ~~LOC~~ HOSPITAL LAB): SARS Coronavirus 2: NEGATIVE

## 2018-08-22 MED ORDER — FLUOXETINE HCL 20 MG PO CAPS
20.0000 mg | ORAL_CAPSULE | Freq: Every day | ORAL | Status: DC
  Administered 2018-08-22 – 2018-08-27 (×4): 20 mg via ORAL
  Filled 2018-08-22 (×7): qty 1

## 2018-08-22 MED ORDER — FLUOXETINE HCL 20 MG PO CAPS
ORAL_CAPSULE | ORAL | Status: AC
  Filled 2018-08-22: qty 1

## 2018-08-22 NOTE — Progress Notes (Signed)
[facility]  08/22/2018 12:55 PM Deborah BaldingShannon M Arnold  MRN:  [id number]    Subjective:  Patient states that she was brought to the hospital related to an argument she had with "the father of my kids"  Patient seen face to face by this provider, Dr. Jola Babinskilary; and chart reviewed and consulted with Dr. Lucianne MussKumar on 08/22/18.  On evaluation Deborah BaldingShannon M Arnold reports she has been off of her psychotropic medications at first patient states off med's for 2 weeks but later makes a statement like she has been off longer.  Patient states that she is not have suicidal/homicidal thoughts; but is hearing negative voices (thoughts) in head that she is "worthless and no good".  When asked patient about getting collateral information.  "They ain't gone tell you the truth about nothing,  Patient also states that she smokes "Over the counter marijuana (Hemp)" daily.  They gonna say what ever their father tells them to say."  Patient states that she lives with her daughter and the father of her 3 children.  Patient appears to be minimizing condition.  Patient does appear to have some paranoia.  Recommending psychiatric hospitalization for medication management and stabilization.      Principal Problem: Bipolar disorder (HCC) Diagnosis: Principal Problem:   Bipolar disorder (HCC)  Total Time spent with patient: 30 minutes  Past Psychiatric History: Bipolar disorder, depression, alcohol abuse  Past Medical History:  Past Medical History:  Diagnosis Date  . Abortion    x2  . Family history of anesthesia complication    " my son has malignant hypothermia "  . Preterm labor   . Urinary tract infection     Past Surgical History:  Procedure Laterality Date  . INDUCED ABORTION     x2  . KNEE CARTILAGE SURGERY  1/08  . ORIF ANKLE FRACTURE Right 08/28/2012   Dr Lajoyce Cornersuda  . ORIF ANKLE FRACTURE Right 08/28/2012   Procedure: OPEN REDUCTION INTERNAL FIXATION (ORIF) RIGHT TRIMALEOLAR  ANKLE FRACTURE;  Surgeon: Nadara MustardMarcus V [name],  MD;  Location: [facility] OR;  Service: Orthopedics;  Laterality: Right;  . TUBAL LIGATION Bilateral 05/18/2012   Procedure: POST PARTUM TUBAL LIGATION;  Surgeon: Adam PhenixJames G Arnold, MD;  Location: [facility] ORS;  Service: Gynecology;  Laterality: Bilateral;   Family History:  Family History  Problem Relation Age of Onset  . Cancer Other        Breast-maternal side   Family Psychiatric  History: Unaware Social History:  Social History   Substance and Sexual Activity  Alcohol Use Yes  . Alcohol/week: 1.0 standard drinks  . Types: 1 Cans of beer per week   Comment: "1 beer a week for constipation" per patient-in early pregnancy     Social History   Substance and Sexual Activity  Drug Use Yes  . Types: Marijuana    Social History   Socioeconomic History  . Marital status: Divorced    Spouse name: Not on file  . Number of children: Not on file  . Years of education: Not on file  . Highest education level: Not on file  Occupational History  . Not on file  Social Needs  . Financial resource strain: Not on file  . Food insecurity    Worry: Not on file    Inability: Not on file  . Transportation needs    Medical: Not on file    Non-medical: Not on file  Tobacco Use  . Smoking status: Current Some Day Smoker  Packs/day: 0.50    Years: 26.00    Pack years: 13.00    Types: Cigarettes  . Smokeless tobacco: Never Used  Substance and Sexual Activity  . Alcohol use: Yes    Alcohol/week: 1.0 standard drinks    Types: 1 Cans of beer per week    Comment: "1 beer a week for constipation" per patient-in early pregnancy  . Drug use: Yes    Types: Marijuana  . Sexual activity: Yes    Birth control/protection: None  Lifestyle  . Physical activity    Days per week: Not on file    Minutes per session: Not on file  . Stress: Not on file  Relationships  . Social Musicianconnections    Talks on phone: Not on file    Gets together: Not on file    Attends religious service: Not on file    Active member  of club or organization: Not on file    Attends meetings of clubs or organizations: Not on file    Relationship status: Not on file  Other Topics Concern  . Not on file  Social History Narrative  . Not on file   Additional Social History:    Pain Medications: Please see MAR Prescriptions: Please see MAR Over the Counter: Please see MAR History of alcohol / drug use?: Yes Longest period of sobriety (when/how long): Unknown Name of Substance 1: EtOH 1 - Age of First Use: Unknown 1 - Amount (size/oz): 4-6 12 ounce beers 1 - Frequency: 4x/week 1 - Duration: Unknown 1 - Last Use / Amount: Today (08/21/2018)                  Sleep: Fair  Appetite:  Good  Current Medications: Current Facility-Administered Medications  Medication Dose Route Frequency Provider Last Rate Last Dose  . acetaminophen (TYLENOL) tablet 650 mg  650 mg Oral Q6H PRN Jackelyn PolingBerry, Jason A, NP      . alum & mag hydroxide-simeth (MAALOX/MYLANTA) 200-200-20 MG/5ML suspension 30 mL  30 mL Oral Q4H PRN Nira ConnBerry, Jason A, NP      . FLUoxetine (PROZAC) capsule 20 mg  20 mg Oral Daily Rankin, Shuvon B, NP      . hydrOXYzine (ATARAX/VISTARIL) tablet 25 mg  25 mg Oral Q6H PRN Nira ConnBerry, Jason A, NP   25 mg at 08/21/18 2222  . magnesium hydroxide (MILK OF MAGNESIA) suspension 30 mL  30 mL Oral Daily PRN Nira ConnBerry, Jason A, NP      . traZODone (DESYREL) tablet 50 mg  50 mg Oral QHS PRN,MR X 1 Nira ConnBerry, Jason A, NP   50 mg at 08/21/18 2222    Lab Results:  Results for orders placed or performed during the hospital encounter of 08/21/18 (from the past 48 hour(s))  SARS Coronavirus 2 (CEPHEID - [facility]), Hosp Order     Status: None   Collection Time: 08/21/18  9:33 PM   Specimen: Nasopharyngeal Swab  Result Value Ref Range   SARS Coronavirus 2 NEGATIVE NEGATIVE    Comment: (NOTE) If result is NEGATIVE SARS-CoV-2 target nucleic acids are NOT DETECTED. The SARS-CoV-2 RNA is generally detectable in upper  and lower  respiratory specimens during the acute phase of infection. The lowest  concentration of SARS-CoV-2 viral copies this assay can detect is 250  copies / mL. A negative result does not preclude SARS-CoV-2 infection  and should not be used as the sole basis for treatment or other  patient management decisions.  A negative result may occur with  improper specimen collection / handling, submission of specimen other  than nasopharyngeal swab, presence of viral mutation(s) within the  areas targeted by this assay, and inadequate number of viral copies  (<250 copies / mL). A negative result must be combined with clinical  observations, patient history, and epidemiological information. If result is POSITIVE SARS-CoV-2 target nucleic acids are DETECTED. The SARS-CoV-2 RNA is generally detectable in upper and lower  respiratory specimens dur ing the acute phase of infection.  Positive  results are indicative of active infection with SARS-CoV-2.  Clinical  correlation with patient history and other diagnostic information is  necessary to determine patient infection status.  Positive results do  not rule out bacterial infection or co-infection with other viruses. If result is PRESUMPTIVE POSTIVE SARS-CoV-2 nucleic acids MAY BE PRESENT.   A presumptive positive result was obtained on the submitted specimen  and confirmed on repeat testing.  While 2019 novel coronavirus  (SARS-CoV-2) nucleic acids may be present in the submitted sample  additional confirmatory testing may be necessary for epidemiological  and / or clinical management purposes  to differentiate between  SARS-CoV-2 and other Sarbecovirus currently known to infect humans.  If clinically indicated additional testing with an alternate test  methodology [phone]) is advised. The SARS-CoV-2 RNA is generally  detectable in upper and lower respiratory sp ecimens during the acute  phase of infection. The expected result is  Negative. Fact Sheet for Patients:  StrictlyIdeas.no Fact Sheet for Healthcare Providers: BankingDealers.co.za This test is not yet approved or cleared by the [place] FDA and has been authorized for detection and/or diagnosis of SARS-CoV-2 by FDA under an Emergency Use Authorization (EUA).  This EUA will remain in effect (meaning this test can be used) for the duration of the COVID-19 declaration under Section 564(b)(1) of the Act, 21 U.S.C. section 360bbb-3(b)(1), unless the authorization is terminated or revoked sooner. Performed at [facility], [place] [address] [place], [place] [address]   Urine rapid drug screen (hosp performed)not at [facility]     Status: Abnormal   Collection Time: 08/21/18 10:53 PM  Result Value Ref Range   Opiates NONE DETECTED NONE DETECTED   Cocaine NONE DETECTED NONE DETECTED   Benzodiazepines NONE DETECTED NONE DETECTED   Amphetamines NONE DETECTED NONE DETECTED   Tetrahydrocannabinol POSITIVE (A) NONE DETECTED   Barbiturates NONE DETECTED NONE DETECTED    Comment: (NOTE) DRUG SCREEN FOR MEDICAL PURPOSES ONLY.  IF CONFIRMATION IS NEEDED FOR ANY PURPOSE, NOTIFY LAB WITHIN 5 DAYS. LOWEST DETECTABLE LIMITS FOR URINE DRUG SCREEN Drug Class                     Cutoff (ng/mL) Amphetamine and metabolites    1000 Barbiturate and metabolites    200 Benzodiazepine                 132 Tricyclics and metabolites     300 Opiates and metabolites        300 Cocaine and metabolites        300 THC                            50 Performed at [facility], [place] [address], [place], [place] [address]   CBC     Status: None   Collection Time: 08/22/18  7:13 AM  Result Value Ref Range   WBC 7.4 4.0 -  10.5 K/uL   RBC 4.88 3.87 - 5.11 MIL/uL   Hemoglobin 13.9 12.0 - 15.0 g/dL   HCT 16.1 09.6 - 04.5 %   MCV 88.7 80.0 - 100.0 fL   MCH 28.5 26.0 - 34.0 pg   MCHC 32.1 30.0 - 36.0 g/dL    RDW 40.9 81.1 - 91.4 %   Platelets 263 150 - 400 K/uL   nRBC 0.0 0.0 - 0.2 %    Comment: Performed at [facility], [address] [place], [place] [address]  Comprehensive metabolic panel     Status: Abnormal   Collection Time: 08/22/18  7:13 AM  Result Value Ref Range   Sodium 137 135 - 145 mmol/L   Potassium 3.5 3.5 - 5.1 mmol/L   Chloride 107 98 - 111 mmol/L   CO2 19 (L) 22 - 32 mmol/L   Glucose, Bld 95 70 - 99 mg/dL   BUN 6 6 - 20 mg/dL   Creatinine, Ser 6.21 0.44 - 1.00 mg/dL   Calcium 8.8 (L) 8.9 - 10.3 mg/dL   Total Protein 7.2 6.5 - 8.1 g/dL   Albumin 4.2 3.5 - 5.0 g/dL   AST 20 15 - 41 U/L   ALT 18 0 - 44 U/L   Alkaline Phosphatase 52 38 - 126 U/L   Total Bilirubin 0.4 0.3 - 1.2 mg/dL   GFR calc non Af Amer >60 >60 mL/min   GFR calc Af Amer >60 >60 mL/min   Anion gap 11 5 - 15    Comment: Performed at [facility], [address], [place], [place] [address]    Blood Alcohol level:  Lab Results  Component Value Date   ETH <5 12/31/2015   ETH <5 01/19/2015    Metabolic Disorder Labs: No results found for: HGBA1C, MPG No results found for: PROLACTIN No results found for: CHOL, TRIG, HDL, CHOLHDL, VLDL, LDLCALC  Physical Findings: AIMS: Facial and Oral Movements Muscles of Facial Expression: None, normal Lips and Perioral Area: None, normal Jaw: None, normal Tongue: None, normal,Extremity Movements Upper (arms, wrists, hands, fingers): None, normal Lower (legs, knees, ankles, toes): None, normal, Trunk Movements Neck, shoulders, hips: None, normal, Overall Severity Severity of abnormal movements (highest score from questions above): None, normal Incapacitation due to abnormal movements: None, normal Patient's awareness of abnormal movements (rate only patient's report): No Awareness, Dental Status Current problems with teeth and/or dentures?: No Does patient usually wear dentures?: No  CIWA:    COWS:      Musculoskeletal: Strength & Muscle Tone: within normal limits Gait & Station: normal Patient leans: N/A  Psychiatric Specialty Exam: Physical Exam  Nursing note and vitals reviewed. Constitutional: She is oriented to person, place, and time. She appears well-developed and well-nourished.  Neck: Normal range of motion.  Respiratory: Effort normal.  Musculoskeletal: Normal range of motion.  Neurological: She is alert and oriented to person, place, and time.  Skin: Skin is warm and dry.  Psychiatric: Her speech is normal. Thought content is paranoid. She exhibits a depressed mood.    Review of Systems  Psychiatric/Behavioral: Positive for depression and substance abuse. Hallucinations: Denies. Suicidal ideas: Denies. The patient is nervous/anxious.   All other systems reviewed and are negative.   Blood pressure (!) 143/100, pulse (!) 104, temperature 98.8 F (37.1 C), temperature source Oral, resp. rate 18, SpO2 100 %.There is no height or weight on file to calculate BMI.  General Appearance: Casual  Eye Contact:  Good  Speech:  Clear and Coherent and Normal Rate  Volume:  Normal  Mood:  Anxious, Depressed, Hopeless and Worthless  Affect:  Congruent and Depressed  Thought Process:  Coherent and Disorganized  Orientation:  Full (Time, Place, and Person)  Thought Content:  Hallucinations: Auditory and Paranoid Ideation  Suicidal Thoughts:  Denies at this time  Homicidal Thoughts:  No  Memory:  Immediate;   Fair Recent;   Fair  Judgement:  Fair  Insight:  Lacking  Psychomotor Activity:  Normal  Concentration:  Concentration: Fair  Recall:  [employer]Fair  Fund of Knowledge:  Fair  Language:  Good  Akathisia:  No  Handed:  Right  AIMS (if indicated):     Assets:  Communication Skills Desire for Improvement Housing Social Support  ADL's:  Intact  Cognition:  WNL  Sleep:        Treatment Plan Summary: Daily contact with patient to assess and evaluate symptoms and progress in  treatment, Medication management and Plan Psychiatric hospitalization  Medications restarted: Prozac 20 mg daily  Shuvon Rankin, NP 08/22/2018, 12:55 PM
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
## 2018-08-22 NOTE — Progress Notes (Signed)
Patient did not attend wrap up group. 

## 2018-08-23 DIAGNOSIS — F259 Schizoaffective disorder, unspecified: Secondary | ICD-10-CM

## 2018-08-23 MED ORDER — LORAZEPAM 1 MG PO TABS: 1.0000 mg | ORAL_TABLET | ORAL | Status: DC | PRN

## 2018-08-23 MED ORDER — ARIPIPRAZOLE 5 MG PO TABS
5.0000 mg | ORAL_TABLET | Freq: Every day | ORAL | Status: DC
  Administered 2018-08-23 – 2018-08-24 (×2): 5 mg via ORAL
  Filled 2018-08-23 (×5): qty 1

## 2018-08-23 MED ORDER — TRAZODONE HCL 50 MG PO TABS
50.0000 mg | ORAL_TABLET | Freq: Every evening | ORAL | Status: DC | PRN
  Filled 2018-08-23: qty 1

## 2018-08-23 MED ORDER — OLANZAPINE 5 MG PO TBDP: 5.0000 mg | ORAL_TABLET | Freq: Three times a day (TID) | ORAL | Status: DC | PRN

## 2018-08-23 MED ORDER — ZIPRASIDONE MESYLATE 20 MG IM SOLR: 10.0000 mg | INTRAMUSCULAR | Status: DC | PRN

## 2018-08-23 NOTE — Progress Notes (Signed)
Patient with depressed mood, staying in her room throughout the evening.  Upset on phone call stating, "you're a crack head" and slamming phone.  No complaints throughout the evening.  Safety maintained on unit.   [place] NOVEL CORONAVIRUS (COVID-19) DAILY CHECK-OFF SYMPTOMS - answer yes or no to each - every day NO YES  Have you had a fever in the past 24 hours?  . Fever (Temp > 37.80C / 100F) X   Have you had any of these symptoms in the past 24 hours? . New Cough .  Sore Throat  .  Shortness of Breath .  Difficulty Breathing .  Unexplained Body Aches   X   Have you had any one of these symptoms in the past 24 hours not related to allergies?   . Runny Nose .  Nasal Congestion .  Sneezing   X   If you have had runny nose, nasal congestion, sneezing in the past 24 hours, has it worsened?  X   EXPOSURES - check yes or no X   Have you traveled outside the state in the past 14 days?  X   Have you been in contact with someone with a confirmed diagnosis of COVID-19 or PUI in the past 14 days without wearing appropriate PPE?  X   Have you been living in the same home as a person with confirmed diagnosis of COVID-19 or a PUI (household contact)?    X   Have you been diagnosed with COVID-19?    X              What to do next: Answered NO to all: Answered YES to anything:   Proceed with unit schedule Follow the [facility] Inpatient Flowsheet.
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
## 2018-08-23 NOTE — Progress Notes (Signed)
Psychoeducational Group Note  Date:  08/23/2018 Time:  2030  Group Topic/Focus:  wrap up group  Participation Level: Did Not Attend  Participation Quality:  Not Applicable  Affect:  Not Applicable  Cognitive:  Not Applicable  Insight:  Not Applicable  Engagement in Group: Not Applicable  Additional Comments:  Pt joined group at the start but left and returned to bed within 5 minutes.   Winfield Rast S 08/23/2018, 10:07 PM

## 2018-08-23 NOTE — H&P (Signed)
Behavioral Health Medical Screening Exam  Deborah Arnold is an 48 y.o. female.   Psychiatric Specialty Exam: Physical Exam  Constitutional: She is oriented to person, place, and time. She appears well-developed and well-nourished. No distress.  HENT:  Head: Normocephalic and atraumatic.  Right Ear: External ear normal.  Left Ear: External ear normal.  Eyes: Pupils are equal, round, and reactive to light. Right eye exhibits no discharge. Left eye exhibits no discharge.  Respiratory: Effort normal. No respiratory distress.  Musculoskeletal: Normal range of motion.  Neurological: She is alert and oriented to person, place, and time.  Skin: She is not diaphoretic.  Psychiatric: Her speech is normal. Her mood appears anxious. She is not withdrawn and not actively hallucinating. Thought content is not paranoid and not delusional. She expresses impulsivity and inappropriate judgment. She exhibits a depressed mood. She expresses no homicidal and no suicidal ideation.    Review of Systems  Constitutional: Negative for chills, diaphoresis, fever, malaise/fatigue and weight loss.  Respiratory: Negative for cough and shortness of breath.   Cardiovascular: Negative for chest pain.  Gastrointestinal: Negative for diarrhea, nausea and vomiting.  Psychiatric/Behavioral: Positive for depression, hallucinations and substance abuse. Negative for memory loss and suicidal ideas. The patient is nervous/anxious and has insomnia.     Blood pressure (!) 143/100, pulse (!) 104, temperature 98.8 F (37.1 C), temperature source Oral, resp. rate 18, SpO2 100 %.There is no height or weight on file to calculate BMI.  General Appearance: Casual and Fairly Groomed  Eye Contact:  Fair  Speech:  Clear and Coherent and Normal Rate  Volume:  Normal  Mood:  Depressed  Affect:  Congruent, Depressed and Tearful  Thought Process:  Coherent, Goal Directed and Descriptions of Associations: Intact  Orientation:  Full  (Time, Place, and Person)  Thought Content:  Logical and Hallucinations: Auditory  Suicidal Thoughts:  No  Homicidal Thoughts:  No  Memory:  Immediate;   Fair Recent;   Fair  Judgement:  Intact  Insight:  Lacking  Psychomotor Activity:  Normal  Concentration:  Concentration: Fair and Attention Span: Fair  Recall:  [employer] of Knowledge:  Good  Language:  Good  Akathisia:  Negative  Handed:  Right  AIMS (if indicated):     Assets:  Communication Skills Desire for Improvement Financial Resources/Insurance Housing Intimacy Leisure Time Physical Health  ADL's:  Intact  Cognition:  WNL  Sleep:           Blood pressure (!) 122/98, pulse 93, temperature 98.1 F (36.7 C), temperature source Oral, resp. rate 18, SpO2 100 %.  Recommendations:  Based on my evaluation the patient does not appear to have an emergency medical condition.  Rozetta Nunnery, NP 08/23/2018, 12:26 AM

## 2018-08-23 NOTE — Progress Notes (Signed)
D: Pt was in her room upon initial approach.  Pt presents with depressed affect and mood.  She reports her day was "pretty crappy" because "I can't see my children.  I feel like I'm being punished."  Pt denies SI/HI, denies hallucinations, denies pain.  Pt has been visible in milieu intermittently.  She interacts with peers and staff appropriately.  A: Introduced self to pt.  Met with pt 1:1.  Actively listened to pt and offered support and encouragement.  15 minute safety checks in place.  R: Pt is safe on the unit.  Pt verbally contracts for safety.  Will continue to monitor and assess.

## 2018-08-23 NOTE — BHH Suicide Risk Assessment (Signed)
[facility] Admission Suicide Risk Assessment   Nursing information obtained from:  Patient Demographic factors:  Caucasian, Low socioeconomic status, Unemployed Current Mental Status:  NA Loss Factors:  NA Historical Factors:  NA Risk Reduction Factors:  NA  Total Time spent with patient: 45 minutes Principal Problem: Consider Schizoaffective Disorder Diagnosis:  Principal Problem:   Bipolar disorder ([place])  Subjective Data:   Continued Clinical Symptoms:  Alcohol Use Disorder Identification Test Final Score (AUDIT): 4 The "Alcohol Use Disorders Identification Test", Guidelines for Use in Primary Care, Second Edition.  World Pharmacologist [facility]). Score between 0-7:  no or low risk or alcohol related problems. Score between 8-15:  moderate risk of alcohol related problems. Score between 16-19:  high risk of alcohol related problems. Score 20 or above:  warrants further diagnostic evaluation for alcohol dependence and treatment.   CLINICAL FACTORS:  48 year old female, lives with boyfriend and children, admitted under IVC, which reported that patient had made suicidal statements and experiencing hallucinations, as well as acting aggressively at home. Patient acknowledges depression, some neurovegetative symptoms, passive SI but denies having made any suicidal statements.  She presents with psychotic symptoms -endorses auditory hallucinations.  She describes hearing people criticizing her/demeaning her when they walk by.  Explains that they can do this without actually speaking and these criticisms are described as telepathic.  She also reports fears that her food is being manipulated in order to make her more sensitive to people's negativity.  She describes psychotic symptoms even in the absence of depression or mood symptoms. Prior history of cocaine abuse, denies any recent heavy drinking or drug use other than cannabis.     Psychiatric Specialty Exam: Physical Exam  ROS  Blood  pressure 113/82, pulse 97, temperature 98 F (36.7 C), temperature source Oral, resp. rate 18, SpO2 100 %.There is no height or weight on file to calculate BMI.  See admit note MSE  COGNITIVE FEATURES THAT CONTRIBUTE TO RISK:  Closed-mindedness and Loss of executive function    SUICIDE RISK:   Moderate:  Frequent suicidal ideation with limited intensity, and duration, some specificity in terms of plans, no associated intent, good self-control, limited dysphoria/symptomatology, some risk factors present, and identifiable protective factors, including available and accessible social support.  PLAN OF CARE: Patient will be admitted to inpatient psychiatric unit for stabilization and safety. Will provide and encourage milieu participation. Provide medication management and maked adjustments as needed.  Will follow daily.    I certify that inpatient services furnished can reasonably be expected to improve the patient's condition.   Jenne Campus, MD 08/23/2018, 2:58 PM

## 2018-08-23 NOTE — Progress Notes (Signed)
CSW attempted to meet with patient to complete PSA. Patient was talking to herself in her room, CSW returned later and patient was taking a shower.  CSW will follow up at a later time.  Stephanie Acre, LCSW-A Clinical Social Worker

## 2018-08-23 NOTE — Plan of Care (Addendum)
Patient is seen screaming into the telephone, cursing. Patient was medicated with Vistaril and redirected to her room. Says she is upset because she wants to go home. Denies SI HI AVH, but is clearly responding to internal stimuli. Patient has been seen sitting in her room by herself talking to herself.  Problem: Education: Goal: Knowledge of  General Education information/materials will improve Outcome: Progressing Goal: Emotional status will improve Outcome: Progressing Goal: Mental status will improve Outcome: Progressing Goal: Verbalization of understanding the information provided will improve Outcome: Progressing

## 2018-08-24 DIAGNOSIS — F3164 Bipolar disorder, current episode mixed, severe, with psychotic features: Secondary | ICD-10-CM

## 2018-08-24 LAB — URINALYSIS, COMPLETE (UACMP) WITH MICROSCOPIC
Bacteria, UA: NONE SEEN
Bilirubin Urine: NEGATIVE
Glucose, UA: NEGATIVE mg/dL
Ketones, ur: NEGATIVE mg/dL
Leukocytes,Ua: NEGATIVE
Nitrite: NEGATIVE
Protein, ur: NEGATIVE mg/dL
Specific Gravity, Urine: 1.019 (ref 1.005–1.030)
pH: 5 (ref 5.0–8.0)

## 2018-08-24 LAB — LIPID PANEL
Cholesterol: 159 mg/dL (ref 0–200)
HDL: 51 mg/dL (ref >40)
LDL Cholesterol: 91 mg/dL (ref 0–99)
Total CHOL/HDL Ratio: 3.1 RATIO
Triglycerides: 87 mg/dL (ref <150)
VLDL: 17 mg/dL (ref 0–40)

## 2018-08-24 LAB — HEMOGLOBIN A1C
Hgb A1c MFr Bld: 5 % (ref 4.8–5.6)
Mean Plasma Glucose: 96.8 mg/dL

## 2018-08-24 LAB — TSH: TSH: 0.352 u[IU]/mL (ref 0.350–4.500)

## 2018-08-24 LAB — PREGNANCY, URINE: Preg Test, Ur: NEGATIVE

## 2018-08-24 MED ORDER — BENZTROPINE MESYLATE 0.5 MG PO TABS
0.5000 mg | ORAL_TABLET | Freq: Two times a day (BID) | ORAL | Status: DC
  Filled 2018-08-24 (×6): qty 1

## 2018-08-24 MED ORDER — FLUPHENAZINE HCL 10 MG PO TABS
10.0000 mg | ORAL_TABLET | Freq: Every day | ORAL | Status: DC
  Administered 2018-08-24: 22:00:00 10 mg via ORAL
  Filled 2018-08-24: qty 2
  Filled 2018-08-24 (×3): qty 1

## 2018-08-24 MED ORDER — GABAPENTIN 300 MG PO CAPS
300.0000 mg | ORAL_CAPSULE | Freq: Three times a day (TID) | ORAL | Status: DC
  Administered 2018-08-25 – 2018-08-27 (×6): 300 mg via ORAL
  Filled 2018-08-24 (×15): qty 1

## 2018-08-24 MED ORDER — TEMAZEPAM 15 MG PO CAPS
30.0000 mg | ORAL_CAPSULE | Freq: Every day | ORAL | Status: DC
  Administered 2018-08-24: 30 mg via ORAL
  Filled 2018-08-24 (×2): qty 2

## 2018-08-24 MED ORDER — FLUPHENAZINE HCL 5 MG PO TABS
5.0000 mg | ORAL_TABLET | Freq: Every day | ORAL | Status: DC
  Administered 2018-08-24: 5 mg via ORAL
  Filled 2018-08-24 (×4): qty 1
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
## 2018-08-24 NOTE — Progress Notes (Signed)
Patient ID: Deborah Arnold, female   DOB: 1970-03-25, 48 y.o.   MRN: [id number]  D: Patient is observed responding to internal stimuli but denies this when writer inquires if patient is hearing voices. Patient appears paranoid and delusional stating her boyfriend "got me in here" and is lying about her behavior at home. Patient initially complaint with medications this morning but refused her medication changes. Patient fixated on discharge and asked writer for a urine specimen cup when she leaves so she can bring her boyfriend's urine somewhere to be tested for cocaine. Patient denies SI/HI and is not withdrawing at this time. A: Safety maintained with q15 minute checks. Medications provided as ordered. Low fall risk precautions in place.  R: Patient remains safe on the unit at this time.

## 2018-08-24 NOTE — Progress Notes (Signed)
Deborah Arnold Progress Note  08/24/2018 11:14 AM Deborah Arnold  MRN:  [id number] Subjective:   As discussed, Deborah Arnold is 47 she has been diagnosed with a schizoaffective/bipolar type condition in addition to PTSD.  Her symptoms include visual hallucinations of "reliving the past" and type flashback form, she also reports auditory hallucinations believing people walk by her shout insults and derogatory remarks "as if it is coming from them" she also reports chronic insomnia is not slept here.  She is focused on discharge however she is unaware of the plethora of legal issues and charges she is faced with To include a restraining order that may keep her away from her home Principal Problem: Bipolar disorder (HCC) Diagnosis: Principal Problem:   Bipolar disorder (HCC)  Total Time spent with patient: 20 minutes  Past Medical History:  Past Medical History:  Diagnosis Date  . Abortion    x2  . Family history of anesthesia complication    " my son has malignant hypothermia "  . Preterm labor   . Urinary tract infection     Past Surgical History:  Procedure Laterality Date  . INDUCED ABORTION     x2  . KNEE CARTILAGE SURGERY  1/08  . ORIF ANKLE FRACTURE Right 08/28/2012   Dr Lajoyce Cornersuda  . ORIF ANKLE FRACTURE Right 08/28/2012   Procedure: OPEN REDUCTION INTERNAL FIXATION (ORIF) RIGHT TRIMALEOLAR  ANKLE FRACTURE;  Surgeon: Nadara MustardMarcus V [name], Arnold;  [facility]: [facility];  Service: Orthopedics;  Laterality: Right;  . TUBAL LIGATION Bilateral 05/18/2012   Procedure: POST PARTUM TUBAL LIGATION;  Surgeon: Adam PhenixJames G Arnold, Arnold;  Location: [facility];  Service: Gynecology;  Laterality: Bilateral;   Family History:  Family History  Problem Relation Age of Onset  . Cancer Other        Breast-maternal side   Family Psychiatric  History: see eval Social History:  Social History   Substance and Sexual Activity  Alcohol Use Yes  . Alcohol/week: 1.0 standard drinks  . Types: 1 Cans of beer per week   Comment: "1 beer a  week for constipation" per patient-in early pregnancy     Social History   Substance and Sexual Activity  Drug Use Yes  . Types: Marijuana    Social History   Socioeconomic History  . Marital status: Divorced    Spouse name: Not on file  . Number of children: Not on file  . Years of education: Not on file  . Highest education level: Not on file  Occupational History  . Not on file  Social Needs  . Financial resource strain: Not on file  . Food insecurity    Worry: Not on file    Inability: Not on file  . Transportation needs    Medical: Not on file    Non-medical: Not on file  Tobacco Use  . Smoking status: Current Some Day Smoker    Packs/day: 0.50    Years: 26.00    Pack years: 13.00    Types: Cigarettes  . Smokeless tobacco: Never Used  Substance and Sexual Activity  . Alcohol use: Yes    Alcohol/week: 1.0 standard drinks    Types: 1 Cans of beer per week    Comment: "1 beer a week for constipation" per patient-in early pregnancy  . Drug use: Yes    Types: Marijuana  . Sexual activity: Yes    Birth control/protection: None  Lifestyle  . Physical activity    Days per week: Not on file  Minutes per session: Not on file  . Stress: Not on file  Relationships  . Social Musicianconnections    Talks on phone: Not on file    Gets together: Not on file    Attends religious service: Not on file    Active member of club or organization: Not on file    Attends meetings of clubs or organizations: Not on file    Relationship status: Not on file  Other Topics Concern  . Not on file  Social History Narrative  . Not on file   Additional Social History:    Pain Medications: Please see MAR Prescriptions: Please see MAR Over the Counter: Please see MAR History of alcohol / drug use?: Yes Longest period of sobriety (when/how long): Unknown Name of Substance 1: EtOH 1 - Age of First Use: Unknown 1 - Amount (size/oz): 4-6 12 ounce beers 1 - Frequency: 4x/week 1 -  Duration: Unknown 1 - Last Use / Amount: Today (08/21/2018)                  Sleep: 2 hrs Appetite:  Good  Current Medications: Current Facility-Administered Medications  Medication Dose Route Frequency Provider Last Rate Last Dose  . acetaminophen (TYLENOL) tablet 650 mg  650 mg Oral Q6H PRN Jackelyn PolingBerry, Jason A, NP      . alum & mag hydroxide-simeth (MAALOX/MYLANTA) 200-200-20 MG/5ML suspension 30 mL  30 mL Oral Q4H PRN Nira ConnBerry, Jason A, NP      . benztropine (COGENTIN) tablet 0.5 mg  0.5 mg Oral BID Malvin JohnsFarah, Negan Grudzien, Arnold      . FLUoxetine (PROZAC) capsule 20 mg  20 mg Oral Daily Rankin, Shuvon B, NP   20 mg at 08/23/18 0823  . fluPHENAZine (PROLIXIN) tablet 10 mg  10 mg Oral QHS Malvin JohnsFarah, Townsend Cudworth, Arnold      . fluPHENAZine (PROLIXIN) tablet 5 mg  5 mg Oral Daily Malvin JohnsFarah, Versa Craton, Arnold      . gabapentin (NEURONTIN) capsule 300 mg  300 mg Oral TID Malvin JohnsFarah, Chasten Blaze, Arnold      . hydrOXYzine (ATARAX/VISTARIL) tablet 25 mg  25 mg Oral Q6H PRN Nira ConnBerry, Jason A, NP   25 mg at 08/23/18 1432  . OLANZapine zydis (ZYPREXA) disintegrating tablet 5 mg  5 mg Oral Q8H PRN Cobos, Rockey SituFernando A, Arnold       And  . LORazepam (ATIVAN) tablet 1 mg  1 mg Oral PRN Cobos, Rockey SituFernando A, Arnold       And  . ziprasidone (GEODON) injection 10 mg  10 mg Intramuscular PRN Cobos, Rockey SituFernando A, Arnold      . magnesium hydroxide (MILK OF MAGNESIA) suspension 30 mL  30 mL Oral Daily PRN Nira ConnBerry, Jason A, NP      . temazepam (RESTORIL) capsule 30 mg  30 mg Oral QHS Malvin JohnsFarah, Celisa Schoenberg, Arnold      . traZODone (DESYREL) tablet 50 mg  50 mg Oral QHS PRN Cobos, Rockey SituFernando A, Arnold        Lab Results:  Results for orders placed or performed during the hospital encounter of 08/21/18 (from the past 48 hour(s))  Urinalysis, Complete w Microscopic     Status: Abnormal   Collection Time: 08/23/18  2:18 PM  Result Value Ref Range   Color, Urine YELLOW YELLOW   APPearance CLOUDY (A) CLEAR   Specific Gravity, Urine 1.019 1.005 - 1.030   pH 5.0 5.0 - 8.0   Glucose, UA NEGATIVE NEGATIVE  mg/dL   Hgb urine dipstick SMALL (A)  NEGATIVE   Bilirubin Urine NEGATIVE NEGATIVE   Ketones, ur NEGATIVE NEGATIVE mg/dL   Protein, ur NEGATIVE NEGATIVE mg/dL   Nitrite NEGATIVE NEGATIVE   Leukocytes,Ua NEGATIVE NEGATIVE   RBC / HPF 0-5 0 - 5 RBC/hpf   Bacteria, UA NONE SEEN NONE SEEN   Squamous Epithelial / LPF 0-5 0 - 5   Mucus PRESENT    Ca Oxalate Crys, UA PRESENT    Uric Acid Crys, UA PRESENT     Comment: Performed at [facility], [place] [address] [place], [place] [address]  Pregnancy, urine     Status: None   Collection Time: 08/23/18  2:18 PM  Result Value Ref Range   Preg Test, Ur NEGATIVE NEGATIVE    Comment:        THE SENSITIVITY OF THIS METHODOLOGY IS >20 mIU/mL. Performed at [facility], [place] [address], [place], [place] [address]   Hemoglobin A1c     Status: None   Collection Time: 08/24/18  7:03 AM  Result Value Ref Range   Hgb A1c MFr Bld 5.0 4.8 - 5.6 %    Comment: (NOTE) Pre diabetes:          5.7%-6.4% Diabetes:              >6.4% Glycemic control for   <7.0% adults with diabetes    Mean Plasma Glucose 96.8 mg/dL    Comment: Performed at [facility] [address], [place], [place] [address]  Lipid panel     Status: None   Collection Time: 08/24/18  7:03 AM  Result Value Ref Range   Cholesterol 159 0 - 200 mg/dL   Triglycerides 87 <150 mg/dL   HDL 51 >40 mg/dL   Total CHOL/HDL Ratio 3.1 RATIO   VLDL 17 0 - 40 mg/dL   LDL Cholesterol 91 0 - 99 mg/dL    Comment:        Total Cholesterol/HDL:CHD Risk Coronary Heart Disease Risk Table                     Men   Women  1/2 Average Risk   3.4   3.3  Average Risk       5.0   4.4  2 X Average Risk   9.6   7.1  3 X Average Risk  23.4   11.0        Use the calculated Patient Ratio above and the CHD Risk Table to determine the patient's CHD Risk.        ATP III CLASSIFICATION (LDL):  <100     mg/dL   Optimal  100-129  mg/dL   Near or Above                     Optimal  130-159  mg/dL   Borderline  160-189  mg/dL   High  >190     mg/dL   Very High Performed at [facility] [address] [place], [place] [address]   TSH     Status: None   Collection Time: 08/24/18  7:03 AM  Result Value Ref Range   TSH 0.352 0.350 - 4.500 uIU/mL    Comment: Performed by a 3rd Generation assay with a functional sensitivity of <=0.01 uIU/mL. Performed at [facility], [place] [address] [place], [place] [address]     Blood Alcohol level:  Lab Results  Component Value Date  ETH <5 12/31/2015   ETH <5 01/19/2015    Metabolic Disorder Labs: Lab Results  Component Value Date   HGBA1C 5.0 08/24/2018   MPG 96.8 08/24/2018   No results found for: PROLACTIN Lab Results  Component Value Date   CHOL 159 08/24/2018   TRIG 87 08/24/2018   HDL 51 08/24/2018   CHOLHDL 3.1 08/24/2018   VLDL 17 08/24/2018   LDLCALC 91 08/24/2018    Physical Findings: AIMS: Facial and Oral Movements Muscles of Facial Expression: None, normal Lips and Perioral Area: None, normal Jaw: None, normal Tongue: None, normal,Extremity Movements Upper (arms, wrists, hands, fingers): None, normal Lower (legs, knees, ankles, toes): None, normal, Trunk Movements Neck, shoulders, hips: None, normal, Overall Severity Severity of abnormal movements (highest score from questions above): None, normal Incapacitation due to abnormal movements: None, normal Patient's awareness of abnormal movements (rate only patient's report): No Awareness, Dental Status Current problems with teeth and/or dentures?: No Does patient usually wear dentures?: No  CIWA:    COWS:     Musculoskeletal: Strength & Muscle Tone: within normal limits Gait & Station: normal Patient leans: N/A  Psychiatric Specialty Exam: Physical Exam  ROS  Blood pressure 115/69, pulse (!) 116, temperature 98 F (36.7 C), temperature source Oral, resp. rate 16, SpO2 100 %.There is no  height or weight on file to calculate BMI.  General Appearance: Casual  Eye Contact:  Fair  Speech:  Normal Rate  Volume:  Decreased  Mood:  Anxious and Dysphoric  Affect:  Appropriate and Congruent  Thought Process:  Linear and Descriptions of Associations: Tangential  Orientation:  Full (Time, Place, and Person)  Thought Content:  Logical and Hallucinations: Auditory  Suicidal Thoughts:  No  Homicidal Thoughts:  No  Memory:  Recent;   Fair  Judgement:  Fair  Insight:  Fair  Psychomotor Activity:  Normal  Concentration:  Concentration: Fair  Recall:  Fis[employer]Fair  Fund of Knowledge:  Fair  Language:  Good  Akathisia:  Negative  Handed:  Right  AIMS (if indicated):     Assets:  Physical Health Resilience Social Support  ADL's:  Intact  Cognition:  WNL  Sleep:  Number of Hours: 2.75     Treatment Plan Summary: Daily contact with patient to assess and evaluate symptoms and progress in treatment, Medication management and Plan Several adjustments to try more potent antipsychotic at higher dose try sleep aid continue current cognitive and rehab therapies probable discharge by the end of the weekend but will meet with social worker today  Malvin JohnsFARAH,Arno Cullers, Arnold 08/24/2018, 11:14 AM

## 2018-08-24 NOTE — Progress Notes (Signed)
Patient's significant other called the unit a 2nd time to state that he is taking out a restraining order against the patient and she can't come home.  Significant other states CPS is involved and provided the name and contact information for [facility]. CSW called this same CPS social worker yesterday and left a detailed voicemail with call back information.  CSW called the [facility] social worker a second time, Tammi Klippel [phone]. CSW left a second detailed voicemail.  Stephanie Acre, LCSW-A Clinical Social Worker

## 2018-08-25 MED ORDER — ARIPIPRAZOLE 10 MG PO TABS
10.0000 mg | ORAL_TABLET | Freq: Every day | ORAL | Status: DC
  Administered 2018-08-25 – 2018-08-27 (×3): 10 mg via ORAL
  Filled 2018-08-25 (×5): qty 1

## 2018-08-25 NOTE — Progress Notes (Addendum)
[facility] Progress Note  08/25/2018 2:19 PM Deborah Arnold  MRN:  [id number] Subjective:  "I'm not taking the medications. You keep changing my medications."  Ms. Crounse found resting in bed. She is loud, significantly irritable and upset about medication changes from yesterday. She has been refusing Prolixin. She states, "They try to give me a different medication every time I go to the medication window. I'm not taking it. I need to go home." She insists she needs to go home to her children. It was explained to patient that she needs to stabilize on medication first. Continues to report AH, declines to elaborate on what AH are saying. She refuses Prolixin, states she is willing to take Abilify. She continues to fixate on returning home to see her children. Per chart review, her boyfriend is taking out a restraining order on her, and she will not be able to return home. She denies SI/HI.  From admission H&P: 48 year old female, lives with BF of many years and their children. She presented to ED via Police under IVC, which was petitioned by her BF. IVC reports that patient has been making suicidal statements , telling her children she is going to kill herself, and that she is experiencing hallucinations. It also reports patient has been aggressive, throwing things at her children, abusing drugs .   Principal Problem: Bipolar disorder (HCC) Diagnosis: Principal Problem:   Bipolar disorder (HCC)  Total Time spent with patient: 20 minutes  Past Psychiatric History: See admission H&P  Past Medical History:  Past Medical History:  Diagnosis Date  . Abortion    x2  . Family history of anesthesia complication    " my son has malignant hypothermia "  . Preterm labor   . Urinary tract infection     Past Surgical History:  Procedure Laterality Date  . INDUCED ABORTION     x2  . KNEE CARTILAGE SURGERY  1/08  . ORIF ANKLE FRACTURE Right 08/28/2012   Dr Lajoyce Cornersuda  . ORIF ANKLE FRACTURE Right 08/28/2012   Procedure: OPEN REDUCTION INTERNAL FIXATION (ORIF) RIGHT TRIMALEOLAR  ANKLE FRACTURE;  Surgeon: Nadara MustardMarcus V [name], MD;  Location: [facility] OR;  Service: Orthopedics;  Laterality: Right;  . TUBAL LIGATION Bilateral 05/18/2012   Procedure: POST PARTUM TUBAL LIGATION;  Surgeon: Adam PhenixJames G Arnold, MD;  Location: [facility];  Service: Gynecology;  Laterality: Bilateral;   Family History:  Family History  Problem Relation Age of Onset  . Cancer Other        Breast-maternal side   Family Psychiatric  History: See admission H&P Social History:  Social History   Substance and Sexual Activity  Alcohol Use Yes  . Alcohol/week: 1.0 standard drinks  . Types: 1 Cans of beer per week   Comment: "1 beer a week for constipation" per patient-in early pregnancy     Social History   Substance and Sexual Activity  Drug Use Yes  . Types: Marijuana    Social History   Socioeconomic History  . Marital status: Divorced    Spouse name: Not on file  . Number of children: Not on file  . Years of education: Not on file  . Highest education level: Not on file  Occupational History  . Not on file  Social Needs  . Financial resource strain: Not on file  . Food insecurity    Worry: Not on file    Inability: Not on file  . Transportation needs    Medical: Not on file  Non-medical: Not on file  Tobacco Use  . Smoking status: Current Some Day Smoker    Packs/day: 0.50    Years: 26.00    Pack years: 13.00    Types: Cigarettes  . Smokeless tobacco: Never Used  Substance and Sexual Activity  . Alcohol use: Yes    Alcohol/week: 1.0 standard drinks    Types: 1 Cans of beer per week    Comment: "1 beer a week for constipation" per patient-in early pregnancy  . Drug use: Yes    Types: Marijuana  . Sexual activity: Yes    Birth control/protection: None  Lifestyle  . Physical activity    Days per week: Not on file    Minutes per session: Not on file  . Stress: Not on file  Relationships  . Social Product manager on phone: Not on file    Gets together: Not on file    Attends religious service: Not on file    Active member of club or organization: Not on file    Attends meetings of clubs or organizations: Not on file    Relationship status: Not on file  Other Topics Concern  . Not on file  Social History Narrative  . Not on file   Additional Social History:    Pain Medications: Please see MAR Prescriptions: Please see MAR Over the Counter: Please see MAR History of alcohol / drug use?: Yes Longest period of sobriety (when/how long): Unknown Name of Substance 1: EtOH 1 - Age of First Use: Unknown 1 - Amount (size/oz): 4-6 12 ounce beers 1 - Frequency: 4x/week 1 - Duration: Unknown 1 - Last Use / Amount: Today (08/21/2018)                  Sleep: Fair  Appetite:  Good  Current Medications: Current Facility-Administered Medications  Medication Dose Route Frequency Provider Last Rate Last Dose  . acetaminophen (TYLENOL) tablet 650 mg  650 mg Oral Q6H PRN Rozetta Nunnery, NP      . alum & mag hydroxide-simeth (MAALOX/MYLANTA) 200-200-20 MG/5ML suspension 30 mL  30 mL Oral Q4H PRN Lindon Romp A, NP      . ARIPiprazole (ABILIFY) tablet 10 mg  10 mg Oral Daily Aharon Carriere A, MD      . FLUoxetine (PROZAC) capsule 20 mg  20 mg Oral Daily Rankin, Shuvon B, NP   20 mg at 08/23/18 0823  . gabapentin (NEURONTIN) capsule 300 mg  300 mg Oral TID Johnn Hai, MD      . hydrOXYzine (ATARAX/VISTARIL) tablet 25 mg  25 mg Oral Q6H PRN Lindon Romp A, NP   25 mg at 08/23/18 1432  . OLANZapine zydis (ZYPREXA) disintegrating tablet 5 mg  5 mg Oral Q8H PRN Nene Aranas, Myer Peer, MD       And  . LORazepam (ATIVAN) tablet 1 mg  1 mg Oral PRN Frannie Shedrick, Myer Peer, MD       And  . ziprasidone (GEODON) injection 10 mg  10 mg Intramuscular PRN Milania Haubner, Myer Peer, MD      . magnesium hydroxide (MILK OF MAGNESIA) suspension 30 mL  30 mL Oral Daily PRN Lindon Romp A, NP      . temazepam (RESTORIL)  capsule 30 mg  30 mg Oral QHS Johnn Hai, MD   30 mg at 08/24/18 2139  . traZODone (DESYREL) tablet 50 mg  50 mg Oral QHS PRN Giannie Soliday, Myer Peer, MD  Lab Results:  Results for orders placed or performed during the hospital encounter of 08/21/18 (from the past 48 hour(s))  Hemoglobin A1c     Status: None   Collection Time: 08/24/18  7:03 AM  Result Value Ref Range   Hgb A1c MFr Bld 5.0 4.8 - 5.6 %    Comment: (NOTE) Pre diabetes:          5.7%-6.4% Diabetes:              >6.4% Glycemic control for   <7.0% adults with diabetes    Mean Plasma Glucose 96.8 mg/dL    Comment: Performed at [facility], [address] [place], [place] [address]  Lipid panel     Status: None   Collection Time: 08/24/18  7:03 AM  Result Value Ref Range   Cholesterol 159 0 - 200 mg/dL   Triglycerides 87 <604<150 mg/dL   HDL 51 >54>40 mg/dL   Total CHOL/HDL Ratio 3.1 RATIO   VLDL 17 0 - 40 mg/dL   LDL Cholesterol 91 0 - 99 mg/dL    Comment:        Total Cholesterol/HDL:CHD Risk Coronary Heart Disease Risk Table                     Men   Women  1/2 Average Risk   3.4   3.3  Average Risk       5.0   4.4  2 X Average Risk   9.6   7.1  3 X Average Risk  23.4   11.0        Use the calculated Patient Ratio above and the CHD Risk Table to determine the patient's CHD Risk.        ATP III CLASSIFICATION (LDL):  <100     mg/dL   Optimal  098-119100-129  mg/dL   Near or Above                    Optimal  130-159  mg/dL   Borderline  147-829160-189  mg/dL   High  >562>190     mg/dL   Very High Performed at [facility], [address] [place], [place] [address]   TSH     Status: None   Collection Time: 08/24/18  7:03 AM  Result Value Ref Range   TSH 0.352 0.350 - 4.500 uIU/mL    Comment: Performed by a 3rd Generation assay with a functional sensitivity of <=0.01 uIU/mL. Performed at [facility], [address] [place], [place] [address]     Blood Alcohol level:  Lab  Results  Component Value Date   [facility] <5 12/31/2015   ETH <5 01/19/2015    Metabolic Disorder Labs: Lab Results  Component Value Date   HGBA1C 5.0 08/24/2018   MPG 96.8 08/24/2018   No results found for: PROLACTIN Lab Results  Component Value Date   CHOL 159 08/24/2018   TRIG 87 08/24/2018   HDL 51 08/24/2018   CHOLHDL 3.1 08/24/2018   VLDL 17 08/24/2018   LDLCALC 91 08/24/2018    Physical Findings: AIMS: Facial and Oral Movements Muscles of Facial Expression: None, normal Lips and Perioral Area: None, normal Jaw: None, normal Tongue: None, normal,Extremity Movements Upper (arms, wrists, hands, fingers): None, normal Lower (legs, knees, ankles, toes): None, normal, Trunk Movements Neck, shoulders, hips: None, normal, Overall Severity Severity of abnormal movements (highest score from questions above): None, normal Incapacitation due to  abnormal movements: None, normal Patient's awareness of abnormal movements (rate only patient's report): No Awareness, Dental Status Current problems with teeth and/or dentures?: No Does patient usually wear dentures?: No  CIWA:    COWS:     Musculoskeletal: Strength & Muscle Tone: within normal limits Gait & Station: normal Patient leans: N/A  Psychiatric Specialty Exam: Physical Exam  Nursing note and vitals reviewed. Constitutional: She is oriented to person, place, and time. She appears well-developed and well-nourished.  Respiratory: Effort normal.  Neurological: She is alert and oriented to person, place, and time.    Review of Systems  Constitutional: Negative.   Psychiatric/Behavioral: Positive for depression, hallucinations and substance abuse. Negative for suicidal ideas. The patient is nervous/anxious. The patient does not have insomnia.     Blood pressure 124/88, pulse (!) 115, temperature 98.6 F (37 C), temperature source Oral, resp. rate 16, SpO2 100 %.There is no height or weight on file to calculate BMI.  General  Appearance: Disheveled  Eye Contact:  Good  Speech:  Pressured  Volume:  Increased  Mood:  Irritable  Affect:  Congruent  Thought Process:  Coherent  Orientation:  Full (Time, Place, and Person)  Thought Content:  Hallucinations: Auditory and Rumination  Suicidal Thoughts:  Denies  Homicidal Thoughts:  Denies  Memory:  Immediate;   Fair Recent;   Fair  Judgement:  Impaired  Insight:  Lacking  Psychomotor Activity:  Normal  Concentration:  Concentration: Fair and Attention Span: Poor  Recall:  FiservFair  Fund of Knowledge:  Fair  Language:  Good  Akathisia:  No  Handed:  Right  AIMS (if indicated):     Assets:  Communication Skills Leisure Time Resilience  ADL's:  Intact  Cognition:  WNL  Sleep:  Number of Hours: 6.75     Treatment Plan Summary: Daily contact with patient to assess and evaluate symptoms and progress in treatment and Medication management   Continue inpatient hospitalization.  Discontinue Prolixin Start Abilify 10 mg PO daily for mood stabilization, AH Continue Prozac 20 mg PO daily for mood Continue gabapentin 300 mg PO TID for anxiety/agitation Continue Vistaril 25 mg PO Q6HR PRN anxiety Continue Zyprexa/Ativan/GEodon PRN agitation Continue Restoril 30 mg PO QHS for insomnia Continue trazodone 50 mg PO QHS PRN insomnia  Patient will participate in the therapeutic group milieu.  Discharge disposition in progress.   Aldean BakerJanet E Sykes, NP 08/25/2018, 2:19 PM   Agree with NP Progress Note

## 2018-08-25 NOTE — Progress Notes (Signed)
D: Pt was in bed in her room upon initial approach.  Pt presents with anxious affect and mood.  She reports her day was "fine" and her goal is "sleep."  She reports "I go through a lot of mood swings" when talking about her day.  Pt denies SI/HI, denies hallucinations, denies pain.  Pt has been visible in milieu interacting with peers and staff appropriately.    A: Introduced self to pt.  Met with pt 1:1.  Actively listened to pt and offered support and encouragement.  Medications administered per order.  15 minute safety checks in place.  R: Pt is safe on the unit.  Pt is compliant with medications.  Pt verbally contracts for safety.  Will continue to monitor and assess.

## 2018-08-25 NOTE — Progress Notes (Signed)
[place] NOVEL CORONAVIRUS (COVID-19) DAILY CHECK-OFF SYMPTOMS - answer yes or no to each - every day NO YES  Have you had a fever in the past 24 hours?  . Fever (Temp > 37.80C / 100F) X   Have you had any of these symptoms in the past 24 hours? . New Cough .  Sore Throat  .  Shortness of Breath .  Difficulty Breathing .  Unexplained Body Aches   X   Have you had any one of these symptoms in the past 24 hours not related to allergies?   . Runny Nose .  Nasal Congestion .  Sneezing   X   If you have had runny nose, nasal congestion, sneezing in the past 24 hours, has it worsened?  X   EXPOSURES - check yes or no X   Have you traveled outside the state in the past 14 days?  X   Have you been in contact with someone with a confirmed diagnosis of COVID-19 or PUI in the past 14 days without wearing appropriate PPE?  X   Have you been living in the same home as a person with confirmed diagnosis of COVID-19 or a PUI (household contact)?    X   Have you been diagnosed with COVID-19?    X              What to do next: Answered NO to all: Answered YES to anything:   Proceed with unit schedule Follow the [facility] Inpatient Flowsheet.   

## 2018-08-25 NOTE — Progress Notes (Signed)
D. Pt presents with an anxious affect/ depressed mood- somewhat irritable behavior this am -refused medications this am- reported wanting to see the doctor first- Pt observed in the milieu interacting appropriately with peers.  Pt currently denies SI/HI and AVH A. Labs and vitals monitored.. Pt supported emotionally and encouraged to express concerns and ask questions.   R. Pt remains safe with 15 minute checks. Will continue POC.

## 2018-08-25 NOTE — Progress Notes (Signed)
Psychoeducational Group Note  Date:  08/25/2018 Time:  2030  Group Topic/Focus:  wrap up group  Participation Level: Did Not Attend  Participation Quality:  Not Applicable  Affect:  Not Applicable  Cognitive:  Not Applicable  Insight:  Not Applicable  Engagement in Group: Not Applicable  Additional Comments:  Pt was notified that group was beginning but declined and remained in bed.   Winfield Rast S 08/25/2018, 9:12 PM

## 2018-08-25 NOTE — [facility] Group Notes (Signed)
LCSW Group Therapy Note  08/25/2018   10:00-11:00am   Type of Therapy and Topic:  Group Therapy: Anger Cues and Responses  Participation Level:  Active   Description of Group:   In this group, patients learned how to recognize the physical, cognitive, emotional, and behavioral responses they have to anger-provoking situations.  They identified a recent time they became angry and how they reacted.  They analyzed how their reaction was possibly beneficial and how it was possibly unhelpful.  The group discussed a variety of healthier coping skills that could help with such a situation in the future.  Deep breathing was practiced briefly.  Therapeutic Goals: 1. Patients will remember their last incident of anger and how they felt emotionally and physically, what their thoughts were at the time, and how they behaved. 2. Patients will identify how their behavior at that time worked for them, as well as how it worked against them. 3. Patients will explore possible new behaviors to use in future anger situations. 4. Patients will learn that anger itself is normal and cannot be eliminated, and that healthier reactions can assist with resolving conflict rather than worsening situations.  Summary of Patient Progress:  The patient expressed being angry that she was able to see her children and that this was the first time she had been away. The patient was made aware of the physical and emotional cues that are associated with anger. She is aware that she acquire skills to manage her anger in a positive effective manner. Therapeutic Modalities:   Cognitive Behavioral Therapy  Rolanda Jay

## 2018-08-25 NOTE — [facility] Group Notes (Signed)
[facility] [facility] Group Notes:  (Nursing)  Date:  08/25/2018  Time: 200 PM Type of Therapy:  Nurse Education  Participation Level:  Active  Participation Quality:  Appropriate  Affect:  Appropriate  Cognitive:  Appropriate  Insight:  Appropriate  Engagement in Group:  Engaged  Modes of Intervention:  Discussion and Education  Summary of Progress/Problems: Group played a non competitive Retail banker game that fosters listening skills as well as self expression  Waymond Cera 08/25/2018, 7:11 PM

## 2018-08-26 NOTE — Progress Notes (Signed)
[place] NOVEL CORONAVIRUS (COVID-19) DAILY CHECK-OFF SYMPTOMS - answer yes or no to each - every day NO YES  Have you had a fever in the past 24 hours?  . Fever (Temp > 37.80C / 100F) X   Have you had any of these symptoms in the past 24 hours? . New Cough .  Sore Throat  .  Shortness of Breath .  Difficulty Breathing .  Unexplained Body Aches   X   Have you had any one of these symptoms in the past 24 hours not related to allergies?   . Runny Nose .  Nasal Congestion .  Sneezing   X   If you have had runny nose, nasal congestion, sneezing in the past 24 hours, has it worsened?  X   EXPOSURES - check yes or no X   Have you traveled outside the state in the past 14 days?  X   Have you been in contact with someone with a confirmed diagnosis of COVID-19 or PUI in the past 14 days without wearing appropriate PPE?  X   Have you been living in the same home as a person with confirmed diagnosis of COVID-19 or a PUI (household contact)?    X   Have you been diagnosed with COVID-19?    X              What to do next: Answered NO to all: Answered YES to anything:   Proceed with unit schedule Follow the [facility] Inpatient Flowsheet.   

## 2018-08-26 NOTE — Progress Notes (Signed)
D. Pt presents with an improving mood- friendly upon approach. Visible in the milieu interacting appropriately with peers. Per pt's self inventory, pt rated her hopelessness and anxiety 5's. Pt wrote that her most important goal is "getting better for my kids" and wrote that she will "take the meds" in order for her to meet her goal.  Pt currently denies SI/HI and AVH  A. Labs and vitals monitored. Pt compliant with medications. Pt supported emotionally and encouraged to express concerns and ask questions.   R. Pt remains safe with 15 minute checks. Will continue POC.

## 2018-08-26 NOTE — [facility] Group Notes (Signed)
[place] LCSW Group Therapy Note  Date/Time:  08/26/2018 9:00-10:00 or 10:00-11:00AM  Type of Therapy and Topic:  Group Therapy:  Healthy and Unhealthy Supports  Participation Level:  Active   Description of Group:  Patients in this group were introduced to the idea of adding a variety of healthy supports to address the various needs in their lives.Patients discussed what additional healthy supports could be helpful in their recovery and wellness after discharge in order to prevent future hospitalizations.   An emphasis was placed on using counselor, doctor, therapy groups, 12-step groups, and problem-specific support groups to expand supports.  They also worked as a group on developing a specific plan for several patients to deal with unhealthy supports through [place], psychoeducation with loved ones, and even termination of relationships.   Therapeutic Goals:   1)  discuss importance of adding supports to stay well once out of the hospital  2)  compare healthy versus unhealthy supports and identify some examples of each  3)  generate ideas and descriptions of healthy supports that can be added  4)  offer mutual support about how to address unhealthy supports  5)  encourage active participation in and adherence to discharge plan    Summary of Patient Progress:  The patient stated that current healthy supports in her life are her 2 youngest children  while current unhealthy supports include her husband and her two oldest children.  The patient expressed a willingness to add therapy and medications as supports to help in her recovery journey.   Therapeutic Modalities:   Motivational Interviewing Brief Solution-Focused Therapy  Rolanda Jay

## 2018-08-26 NOTE — Progress Notes (Signed)
D: Pt denies SI/HI/AVH. Pt is pleasant and cooperative. Pt stayed in room much this evening.  A: Pt was offered support and encouragement. Pt was encourage to attend groups. Q 15 minute checks were done for safety.  R: safety maintained on unit.  Problem: Education: Goal: Mental status will improve Outcome: Progressing

## 2018-08-26 NOTE — Progress Notes (Signed)
[facility] MD Progress Note  08/26/2018 2:30 PM Deborah Arnold  MRN:  [id number] Subjective: Patient reports she is feeling "all right".  Currently focusing on discharging soon, stressing she has court dates next week that she does not want to miss.  Thus far tolerating medications well and denies side effects.  Currently denies suicidal ideations.  Objective: I have reviewed chart notes and have met with patient 48 year old female, lives with boyfriend and children, admitted under IVC reporting she was experiencing psychotic symptoms/hallucinations and was acting aggressively at home.  Patient endorsed auditory hallucinations and paranoid ideations, specifically reported feeling that people walk by her criticize her/leaving her and expressed concern that her food was being "manipulated" in order to make her more sensitive to people's "negativity".  Today patient presents noticeably improved.  Currently not irritable or angry.  Pleasant on approach with a noticeably brighter affect.  At this time denies suicidal ideations at present is future oriented, as above, hoping for discharge soon in order to keep court date for next week.  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  States "I love my children , I miss them, I would never hurt them"  Has been compliant with current medication regimen (was put back on Abilify as she had refused to take Prolixin).  Thus far tolerating Abilify well and denies side effects at this time.  Currently her behavior is in good control, she presents pleasant on approach, she is visible in dayroom and is noted be more interactive with peers. Denies any suicidal ideations.    Principal Problem:  Schizoaffective Disorder  Diagnosis: Schizoaffective Disorder Total Time spent with patient: 20 minutes  Past Psychiatric History: See admission H&P  Past  Medical History:  Past Medical History:  Diagnosis Date  . Abortion    x2  . Family history of anesthesia complication    " my son has malignant hypothermia "  . Preterm labor   . Urinary tract infection     Past Surgical History:  Procedure Laterality Date  . INDUCED ABORTION     x2  . KNEE CARTILAGE SURGERY  1/08  . ORIF ANKLE FRACTURE Right 08/28/2012   Dr Sharol Given  . ORIF ANKLE FRACTURE Right 08/28/2012   Procedure: OPEN REDUCTION INTERNAL FIXATION (ORIF) RIGHT TRIMALEOLAR  ANKLE FRACTURE;  Surgeon: Newt Minion, MD;  Location: [facility];  Service: Orthopedics;  Laterality: Right;  . TUBAL LIGATION Bilateral 05/18/2012   Procedure: POST PARTUM TUBAL LIGATION;  Surgeon: Woodroe Mode, MD;  Location: [facility];  Service: Gynecology;  Laterality: Bilateral;   Family History:  Family History  Problem Relation Age of Onset  . Cancer Other        Breast-maternal side   Family Psychiatric  History: See admission H&P Social History:  Social History   Substance and Sexual Activity  Alcohol Use Yes  . Alcohol/week: 1.0 standard drinks  . Types: 1 Cans of beer per week   Comment: "1 beer a week for constipation" per patient-in early pregnancy     Social History   Substance and Sexual Activity  Drug Use Yes  . Types: Marijuana    Social History   Socioeconomic History  . Marital status: Divorced    Spouse name: Not on file  . Number of children: Not on file  . Years of education: Not on file  .  Highest education level: Not on file  Occupational History  . Not on file  Social Needs  . Financial resource strain: Not on file  . Food insecurity    Worry: Not on file    Inability: Not on file  . Transportation needs    Medical: Not on file    Non-medical: Not on file  Tobacco Use  . Smoking status: Current Some Day Smoker    Packs/day: 0.50    Years: 26.00    Pack years: 13.00    Types: Cigarettes  . Smokeless tobacco: Never Used  Substance and Sexual Activity  . Alcohol  use: Yes    Alcohol/week: 1.0 standard drinks    Types: 1 Cans of beer per week    Comment: "1 beer a week for constipation" per patient-in early pregnancy  . Drug use: Yes    Types: Marijuana  . Sexual activity: Yes    Birth control/protection: None  Lifestyle  . Physical activity    Days per week: Not on file    Minutes per session: Not on file  . Stress: Not on file  Relationships  . Social Herbalist on phone: Not on file    Gets together: Not on file    Attends religious service: Not on file    Active member of club or organization: Not on file    Attends meetings of clubs or organizations: Not on file    Relationship status: Not on file  Other Topics Concern  . Not on file  Social History Narrative  . Not on file   Additional Social History:    Pain Medications: Please see MAR Prescriptions: Please see MAR Over the Counter: Please see MAR History of alcohol / drug use?: Yes Longest period of sobriety (when/how long): Unknown Name of Substance 1: EtOH 1 - Age of First Use: Unknown 1 - Amount (size/oz): 4-6 12 ounce beers 1 - Frequency: 4x/week 1 - Duration: Unknown 1 - Last Use / Amount: Today (08/21/2018)  Sleep: Good  Appetite:  Good  Current Medications: Current Facility-Administered Medications  Medication Dose Route Frequency Provider Last Rate Last Dose  . acetaminophen (TYLENOL) tablet 650 mg  650 mg Oral Q6H PRN Lindon Romp A, NP      . alum & mag hydroxide-simeth (MAALOX/MYLANTA) 200-200-20 MG/5ML suspension 30 mL  30 mL Oral Q4H PRN Lindon Romp A, NP      . ARIPiprazole (ABILIFY) tablet 10 mg  10 mg Oral Daily Cobos, Myer Peer, MD   10 mg at 08/26/18 0845  . FLUoxetine (PROZAC) capsule 20 mg  20 mg Oral Daily Rankin, Shuvon B, NP   20 mg at 08/26/18 0845  . gabapentin (NEURONTIN) capsule 300 mg  300 mg Oral TID Johnn Hai, MD   300 mg at 08/26/18 1231  . hydrOXYzine (ATARAX/VISTARIL) tablet 25 mg  25 mg Oral Q6H PRN Lindon Romp A, NP    25 mg at 08/23/18 1432  . OLANZapine zydis (ZYPREXA) disintegrating tablet 5 mg  5 mg Oral Q8H PRN Cobos, Myer Peer, MD       And  . LORazepam (ATIVAN) tablet 1 mg  1 mg Oral PRN Cobos, Myer Peer, MD       And  . ziprasidone (GEODON) injection 10 mg  10 mg Intramuscular PRN Cobos, Fernando A, MD      . magnesium hydroxide (MILK OF MAGNESIA) suspension 30 mL  30 mL Oral Daily PRN Rozetta Nunnery, NP      .  temazepam (RESTORIL) capsule 30 mg  30 mg Oral QHS Johnn Hai, MD   30 mg at 08/24/18 2139  . traZODone (DESYREL) tablet 50 mg  50 mg Oral QHS PRN Cobos, Myer Peer, MD        Lab Results:  No results found for this or any previous visit (from the past 48 hour(s)).  Blood Alcohol level:  Lab Results  Component Value Date   [facility] <5 12/31/2015   ETH <5 57/26/2035    Metabolic Disorder Labs: Lab Results  Component Value Date   HGBA1C 5.0 08/24/2018   MPG 96.8 08/24/2018   No results found for: PROLACTIN Lab Results  Component Value Date   CHOL 159 08/24/2018   TRIG 87 08/24/2018   HDL 51 08/24/2018   CHOLHDL 3.1 08/24/2018   VLDL 17 08/24/2018   LDLCALC 91 08/24/2018    Physical Findings: AIMS: Facial and Oral Movements Muscles of Facial Expression: None, normal Lips and Perioral Area: None, normal Jaw: None, normal Tongue: None, normal,Extremity Movements Upper (arms, wrists, hands, fingers): None, normal Lower (legs, knees, ankles, toes): None, normal, Trunk Movements Neck, shoulders, hips: None, normal, Overall Severity Severity of abnormal movements (highest score from questions above): None, normal Incapacitation due to abnormal movements: None, normal Patient's awareness of abnormal movements (rate only patient's report): No Awareness, Dental Status Current problems with teeth and/or dentures?: No Does patient usually wear dentures?: No  CIWA:    COWS:     Musculoskeletal: Strength & Muscle Tone: within normal limits Gait & Station: normal Patient  leans: N/A  Psychiatric Specialty Exam: Physical Exam  Nursing note and vitals reviewed. Constitutional: She is oriented to person, place, and time. She appears well-developed and well-nourished.  Respiratory: Effort normal.  Neurological: She is alert and oriented to person, place, and time.    Review of Systems  Constitutional: Negative.   Psychiatric/Behavioral: Positive for depression, hallucinations and substance abuse. Negative for suicidal ideas. The patient is nervous/anxious. The patient does not have insomnia.   Currently denies headache or chest pain, denies shortness of breath, no coughing, no vomiting  Blood pressure (!) 121/102, pulse (!) 101, temperature 97.7 F (36.5 C), temperature source Oral, resp. rate 16, SpO2 100 %.There is no height or weight on file to calculate BMI.  General Appearance: Improved grooming  Eye Contact:  Good  Speech:  Normal Rate  Volume:  Normal  Mood:  Noticeably improved mood, today states "I feel better"  Affect:  Less irritable, today not expansive or angry, brighter  Thought Process:  Linear and Descriptions of Associations: Intact  Orientation:  Full (Time, Place, and Person)  Thought Content:  Today does not present focused on paranoid or delusional ideations.  Does not appear internally preoccupied and denies hallucinations at present.  Suicidal Thoughts:  No currently denies any suicidal or self-injurious ideations, denies any homicidal or violent ideations, specifically also denies any thoughts of violence towards her children  Homicidal Thoughts:  No  Memory:  Recent and remote fair  Judgement:  Fair/improving  Insight:  Fair  Psychomotor Activity:  Normal-no psychomotor agitation or restlessness at this time  Concentration:  Concentration: Improving and Attention Span: Improving  Recall:  Good  Fund of Knowledge:  Good  Language:  Good  Akathisia:  No  Handed:  Right  AIMS (if indicated):     Assets:  Communication  Skills Leisure Time Resilience  ADL's:  Intact  Cognition:  WNL  Sleep:  Number of Hours: 7.5  Assessment- 48 year old female, lives with boyfriend and children, admitted under IVC reporting she was experiencing psychotic symptoms/hallucinations and was acting aggressively at home.  Patient endorsed auditory hallucinations and paranoid ideations, specifically reported feeling that people walk by her criticize her/leaving her and expressed concern that her food was being "manipulated" in order to make her more sensitive to people's "negativity".  Today patient presents with noticeable improvement.  She appears calmer, pleasant, no longer angry or irritable, less focused on paranoid ideations, denying auditory hallucinations at this time.  Denies medication side effects and has been compliant with her current medication regimen today.  At this time presents future oriented and is hoping to discharge soon   Treatment Plan Summary: Daily contact with patient to assess and evaluate symptoms and progress in treatment and Medication management  Treatment plan reviewed as below today 6/28 Encourage group and milieu participation Continue Abilify 10 mg PO daily for mood stabilization, AH Continue Prozac 20 mg PO daily for mood Continue Gabapentin 300 mg PO TID for anxiety/agitation Continue Vistaril 25 mg PO Q6HR PRN anxiety Continue Zyprexa/Ativan/GEodon PRN agitation Continue Restoril 30 mg PO QHS for insomnia Continue Trazodone 50 mg PO QHS PRN insomnia Treatment team working on disposition planning options  Jenne Campus, MD 08/26/2018, 2:30 PM   Patient ID: Leslie Andrea, female   DOB: Feb 17, 1971, 48 y.o.   MRN: [id number]

## 2018-08-26 NOTE — Progress Notes (Signed)
D: Pt denies SI/HI/AVH. Pt is pleasant and cooperative. Pt stated she was ok and ready to leave tomorrow A: Pt was offered support and encouragement. Pt was given scheduled medications. Pt was encourage to attend groups. Q 15 minute checks were done for safety.  R:Pt attends groups and interacts well with peers and staff. Pt is taking medication. Pt has no complaints.Pt receptive to treatment and safety maintained on unit.

## 2018-08-27 MED ORDER — ARIPIPRAZOLE ER 400 MG IM SRER: 400.0000 mg | INTRAMUSCULAR | 11 refills | Status: DC

## 2018-08-27 MED ORDER — ARIPIPRAZOLE 20 MG PO TABS: 20.0000 mg | ORAL_TABLET | Freq: Every day | ORAL | 2 refills | Status: DC

## 2018-08-27 MED ORDER — GABAPENTIN 300 MG PO CAPS: 300.0000 mg | ORAL_CAPSULE | Freq: Three times a day (TID) | ORAL | 3 refills | Status: DC

## 2018-08-27 MED ORDER — ARIPIPRAZOLE ER 400 MG IM SRER: 400.0000 mg | INTRAMUSCULAR | Status: DC

## 2018-08-27 MED ORDER — FLUOXETINE HCL 20 MG PO CAPS: 20.0000 mg | ORAL_CAPSULE | Freq: Every day | ORAL | 3 refills | Status: DC

## 2018-08-27 NOTE — Discharge Summary (Signed)
Physician Discharge Summary Note  Patient:  Deborah Arnold is an 48 y.o., female MRN:  [id number] DOB:  [birth year] Patient phone:  [phone] (home)  Patient address:   [address] [place] [id number],  Total Time spent with patient: 15 minutes  Date of Admission:  08/21/2018 Date of Discharge: 08/27/18  Reason for Admission:  suicidal ideation  Principal Problem: Bipolar disorder [facility](HCC) Discharge Diagnoses: Principal Problem:   Bipolar disorder [facility](HCC)   Past Psychiatric History: No prior psychiatric admissions, history of suicide attempt by cutting wrists at age 89. Reports history of depression. Currently does not endorse any clear history of hypomania or mania. Reports history of auditory hallucinations, which she reports have been occurring intermittently over recent years.Of note, states she experiences said hallucinations even when not depressed. Describes some panic attacks and agoraphobia.  Past Medical History:  Past Medical History:  Diagnosis Date  . Abortion    x2  . Family history of anesthesia complication    " my son has malignant hypothermia "  . Preterm labor   . Urinary tract infection     Past Surgical History:  Procedure Laterality Date  . INDUCED ABORTION     x2  . KNEE CARTILAGE SURGERY  1/08  . ORIF ANKLE FRACTURE Right 08/28/2012   Dr Lajoyce Cornersuda  . ORIF ANKLE FRACTURE Right 08/28/2012   Procedure: OPEN REDUCTION INTERNAL FIXATION (ORIF) RIGHT TRIMALEOLAR  ANKLE FRACTURE;  Surgeon: Nadara MustardMarcus V [name], MD;  Location: [facility] OR;  Service: Orthopedics;  Laterality: Right;  . TUBAL LIGATION Bilateral 05/18/2012   Procedure: POST PARTUM TUBAL LIGATION;  Surgeon: Adam PhenixJames G Arnold, MD;  Location: [facility] ORS;  Service: Gynecology;  Laterality: Bilateral;   Family History:  Family History  Problem Relation Age of Onset  . Cancer Other        Breast-maternal side   Family Psychiatric  History: reports brother has history of PTSD. Denies history of suicides in family. Father  has history of alcohol dependence. Social History:  Social History   Substance and Sexual Activity  Alcohol Use Yes  . Alcohol/week: 1.0 standard drinks  . Types: 1 Cans of beer per week   Comment: "1 beer a week for constipation" per patient-in early pregnancy     Social History   Substance and Sexual Activity  Drug Use Yes  . Types: Marijuana    Social History   Socioeconomic History  . Marital status: Divorced    Spouse name: Not on file  . Number of children: Not on file  . Years of education: Not on file  . Highest education level: Not on file  Occupational History  . Not on file  Social Needs  . Financial resource strain: Not on file  . Food insecurity    Worry: Not on file    Inability: Not on file  . Transportation needs    Medical: Not on file    Non-medical: Not on file  Tobacco Use  . Smoking status: Current Some Day Smoker    Packs/day: 0.50    Years: 26.00    Pack years: 13.00    Types: Cigarettes  . Smokeless tobacco: Never Used  Substance and Sexual Activity  . Alcohol use: Yes    Alcohol/week: 1.0 standard drinks    Types: 1 Cans of beer per week    Comment: "1 beer a week for constipation" per patient-in early pregnancy  . Drug use: Yes    Types: Marijuana  . Sexual activity: Yes  Birth control/protection: None  Lifestyle  . Physical activity    Days per week: Not on file    Minutes per session: Not on file  . Stress: Not on file  Relationships  . Social Musicianconnections    Talks on phone: Not on file    Gets together: Not on file    Attends religious service: Not on file    Active member of club or organization: Not on file    Attends meetings of clubs or organizations: Not on file    Relationship status: Not on file  Other Topics Concern  . Not on file  Social History Narrative  . Not on file    Hospital Course:  From admission H&P: 48 year old female, lives with BF of many years and their children. She presented to ED via Police  under IVC, which was petitioned by her BF. IVC reports that patient has been making suicidal statements , telling her children she is going to kill herself, and that she is experiencing hallucinations. It also reports patient has been aggressive, throwing things at her children, abusing drugs .  Patient does endorse feeling depressed, and also endorses auditory hallucinations.  She states that she hears " people telling me bad things, criticizing me". Describes as " people walk by me and it flows out of them , they do not even have to speak". She also states that she feels her food is " sabotaged" sometimes in order for her to " hear people's negativity". She states that depression, hallucinations have been chronic, intermittent, and that she has struggled with this for several years.  She does endorse intermittent suicidal ideations which she characterizes as passive- denies suicidal plans or intentions. She denies any violent behaviors towards her children, but describes instances of arguments with BF where she has thrown things out of frustration. She describes a past history of alcohol and cocaine abuse, but states has been drinking much less than before ( one beer every few days), and states she has not used cocaine in years . Does endorse cannabis use.  Admission UDS positive for cannabis. She endorses some neuro-vegetative symptoms of depression but denies persistent anhedonia. She also endorses psychotic symptoms as above. Attributes depression in part to relationship stressors with her BF. Reports he has been distant, disinterested, not supportive.   Ms. Melvyn NethLewis was admitted for suicidal ideation with hallucinations and aggressive behaviors. [facility]. She was agitated on admission and observed responding to internal stimuli. She was started on Abilify, Prozac and gabapentin. She participated in group therapy on the unit. Patient's boyfriend called the unit and indicated that  he was taking out a restraining order against the patient and she could not come home. He also indicated that CPS was involved. Patient's case worker was called multiple times with voicemails left but no response was received. Patient states she has spoken to her boyfriend this morning about returning home. CSW attempted to contact boyfriend with no response. Collateral information was obtained from patient's friend who stated that patient could stay with her if needed.   Ms. Melvyn NethLewis has shown improvement in mood, affect, sleep, and interaction. On day of discharge, she is calm and cooperative. She reports good mood and is smiling at times appropriately with interaction. She denies SI/HI and specifically denies thoughts of harming her children. She reports AH have decreased in intensity and severity and are no longer demeaning. She denies CAH. She is no longer  responding to internal stimuli. She states that she is thankful she was hospitalized and appreciates assistance with medications. She reports plans to continue medications after discharge. Denies medication side effects. She is discharging on the medications listed below. [facility] (see below). Her friend is picking her up for discharge home.  Physical Findings: AIMS: Facial and Oral Movements Muscles of Facial Expression: None, normal Lips and Perioral Area: None, normal Jaw: None, normal Tongue: None, normal,Extremity Movements Upper (arms, wrists, hands, fingers): None, normal Lower (legs, knees, ankles, toes): None, normal, Trunk Movements Neck, shoulders, hips: None, normal, Overall Severity Severity of abnormal movements (highest score from questions above): None, normal Incapacitation due to abnormal movements: None, normal Patient's awareness of abnormal movements (rate only patient's report): No Awareness, Dental Status Current problems with teeth and/or dentures?: No Does patient usually wear dentures?: No   CIWA:    COWS:     Musculoskeletal: Strength & Muscle Tone: within normal limits Gait & Station: normal Patient leans: N/A  Psychiatric Specialty Exam: Physical Exam  Nursing note and vitals reviewed. Constitutional: She is oriented to person, place, and time. She appears well-developed and well-nourished.  Cardiovascular: Normal rate.  Respiratory: Effort normal.  Neurological: She is alert and oriented to person, place, and time.    Review of Systems  Constitutional: Negative.   Respiratory: Negative for cough and shortness of breath.   Cardiovascular: Negative for chest pain.  Psychiatric/Behavioral: Positive for depression (stable on medication), hallucinations (stable on medication) and substance abuse (UDS +THC). Negative for suicidal ideas. The patient is not nervous/anxious and does not have insomnia.     Blood pressure (!) 127/91, pulse (!) 109, temperature 97.9 F (36.6 C), temperature source Oral, resp. rate 16, SpO2 100 %.There is no height or weight on file to calculate BMI.  General Appearance: Well Groomed  Eye Contact:  Good  Speech:  Clear and Coherent and Normal Rate  Volume:  Normal  Mood:  Euthymic  Affect:  Appropriate and Congruent  Thought Process:  Coherent and Goal Directed  Orientation:  Full (Time, Place, and Person)  Thought Content:  Logical and Hallucinations: Auditory  Suicidal Thoughts:  No  Homicidal Thoughts:  No  Memory:  Immediate;   Good Recent;   Good  Judgement:  Intact  Insight:  Fair  Psychomotor Activity:  Normal  Concentration:  Concentration: Good  Recall:  Good  Fund of Knowledge:  Fair  Language:  Good  Akathisia:  No  Handed:  Right  AIMS (if indicated):     Assets:  Communication Skills Desire for Improvement Financial Resources/Insurance Housing Resilience Social Support  ADL's:  Intact  Cognition:  WNL  Sleep:  Number of Hours: 5.75        Has this patient used any form of tobacco in the last 30 days?  (Cigarettes, Smokeless Tobacco, Cigars, and/or Pipes)  No  Blood Alcohol level:  Lab Results  Component Value Date   [facility] 12/31/2015   ETH <5 01/19/2015    Metabolic Disorder Labs:  Lab Results  Component Value Date   HGBA1C 5.0 08/24/2018   MPG 96.8 08/24/2018   No results found for: PROLACTIN Lab Results  Component Value Date   CHOL 159 08/24/2018   TRIG 87 08/24/2018   HDL 51 08/24/2018   CHOLHDL 3.1 08/24/2018   VLDL 17 08/24/2018   LDLCALC 91 08/24/2018    See Psychiatric Specialty Exam and Suicide Risk Assessment completed by Attending Physician prior  to discharge.  Discharge destination:  Home  Is patient on multiple antipsychotic therapies at discharge:  No   Has Patient had three or more failed trials of antipsychotic monotherapy by history:  No  Recommended Plan for Multiple Antipsychotic Therapies: NA   Allergies as of 08/27/2018   No Known Allergies     Medication List    STOP taking these medications   citalopram 20 MG tablet Commonly known as: CELEXA     TAKE these medications     Indication  albuterol 108 (90 Base) MCG/ACT inhaler Commonly known as: VENTOLIN HFA Inhale into the lungs every 6 (six) hours as needed for wheezing or shortness of breath.  Indication: Chronic Obstructive Lung Disease   ARIPiprazole ER 400 MG Srer injection Commonly known as: ABILIFY MAINTENA Inject 2 mLs (400 mg total) into the muscle every 28 (twenty-eight) days.  Indication: Schizophrenia   ARIPiprazole 20 MG tablet Commonly known as: ABILIFY Take 1 tablet (20 mg total) by mouth daily.  Indication: Delusions   FLUoxetine 20 MG capsule Commonly known as: PROZAC Take 1 capsule (20 mg total) by mouth daily.  Indication: Generalized Anxiety Disorder, Major Depressive Disorder   gabapentin 300 MG capsule Commonly known as: NEURONTIN Take 1 capsule (300 mg total) by mouth 3 (three) times daily.  Indication: Abuse or Misuse of Alcohol      Follow-up  Information    [facility] Follow up on 09/04/2018.   Why: Telephonic hospital follow up appointment is 7/7 at 8:30a.  Th provider will contact you.  Contact information: [address] [place] [address] [phone]           Follow-up recommendations: Activity as tolerated. Diet as recommended by primary care physician. Keep all scheduled follow-up appointments as recommended.   Comments:   Patient is instructed to take all prescribed medications as recommended. Report any side effects or adverse reactions to your outpatient psychiatrist. Patient is instructed to abstain from alcohol and illegal drugs while on prescription medications. In the event of worsening symptoms, patient is instructed to call the crisis hotline, 911, or go to the nearest emergency department for evaluation and treatment.  Signed: Connye Burkitt, NP 08/27/2018, 9:41 AM

## 2018-08-27 NOTE — BHH Suicide Risk Assessment (Signed)
Deborah Arnold INPATIENT:  Family/Significant Other Suicide Prevention Education  Suicide Prevention Education:  Education Completed;Deborah Arnold, friend, [phone]  has been identified by the patient as the family member/significant other with whom the patient will be residing, and identified as the person(s) who will aid the patient in the event of a mental health crisis (suicidal ideations/suicide attempt).  With written consent from the patient, the family member/significant other has been provided the following suicide prevention education, prior to the and/or following the discharge of the patient.  The suicide prevention education provided includes the following:  Suicide risk factors  Suicide prevention and interventions  National Suicide Hotline telephone number  [facility] assessment telephone number  [facility] Emergency Assistance [place] and/or Residential Mobile Crisis Unit telephone number  Request made of family/significant other to:  Remove weapons (e.g., guns, rifles, knives), all items previously/currently identified as safety concern.  No guns in home, per [place].   Remove drugs/medications (over-the-counter, prescriptions, illicit drugs), all items previously/currently identified as a safety concern.  The family member/significant other verbalizes understanding of the suicide prevention education information provided.  The family member/significant other agrees to remove the items of safety concern listed above.  CSW spoke with Deborah Arnold, who is very familiar with the entire family, reports that she does not believe this is all pt's problem and that her sig other is making things very difficult for her.  Deborah Arnold said that Deborah Arnold is able to stay with her at discharge and they also have another friend, Deborah Arnold, who would also be an option if this is needed.  She is going to work but would be able to pick pt up later if needed.      Joanne Chars, LCSW 08/27/2018, 9:35 AM

## 2018-08-27 NOTE — Progress Notes (Addendum)
Spiritual care group on grief and loss facilitated by chaplain Jerene Pitch  Group Goal:  Support / Education around grief and loss Members engage in facilitated group support and psycho-social education.  Group Description:  Following introductions and group rules, group members engaged in facilitated group dialog and support around topic of loss, with particular support around experiences of loss in their lives. Group Identified types of loss (relationships / self / things) and identified patterns, circumstances, and changes that precipitate losses. Reflected on thoughts / feelings around loss, normalized grief responses, and recognized variety in grief experience. Patient Progress: Present through group.  Spoke with group about challenge of adapting to new normal when not using substances.  States that when she is not hearing voices, she doesn't know what to do.  Spoke with groups about strategies for creating sense of new normal

## 2018-08-27 NOTE — Progress Notes (Signed)
Patient ID: Deborah Arnold, female   DOB: [birth year], 48 y.o.   MRN: [id number] Pt d/c to home with husband. D/c instructions and medications reviewed. Pt verbalizes understanding.

## 2018-08-27 NOTE — Progress Notes (Signed)
  [facility] Adult Case Management Discharge Plan :  Will you be returning to the same living situation after discharge:  Yes,  own home At discharge, do you have transportation home?: Yes,  significant other Do you have the ability to pay for your medications: Yes,  medicaid  Release of information consent forms completed and in the chart;  Patient's signature needed at discharge.  Patient to Follow up at: Follow-up Information    [facility] Follow up on 09/04/2018.   Why: Telephonic hospital follow up appointment is 7/7 at 8:30a.  Th provider will contact you.  Contact information: [address] [place] [address] [phone]           Next level of care provider has access to [place] and Suicide Prevention discussed: Yes,  with friend     Has patient been referred to the Quitline?: Patient refused referral  Patient has been referred for addiction treatment: Yes, Drue Stager, [place] 08/27/2018, 11:41 AM

## 2018-08-27 NOTE — BHH Suicide Risk Assessment (Signed)
[place] INPATIENT:  Family/Significant Other Suicide Prevention Education  Suicide Prevention Education:  Contact Attempts: Kirtland Bouchard, friend, [phone], has been identified by the patient as the family member/significant other with whom the patient will be residing, and identified as the person(s) who will aid the patient in the event of a mental health crisis.  With written consent from the patient, two attempts were made to provide suicide prevention education, prior to and/or following the patient's discharge.  We were unsuccessful in providing suicide prevention education.  A suicide education pamphlet was given to the patient to share with family/significant other.  Date and time of first attempt:08/27/18, 20 Date and time of second attempt:  Joanne Chars, LCSW 08/27/2018, 8:58 AM

## 2018-08-27 NOTE — Progress Notes (Addendum)
CSW spoke with pt about discharge.  CSW inquired about 50B that her significant other reported he had taken out.  Pt states that she spoke to him, Chelsea Primus, earlier this AM and he did not say anything about this.  CSW received permission from pt to add Chelsea Primus to her ROI and contact him.  Pt was also unaware of any DSS/CPS involvement.  Pt said that if there is a 50B, she has several friends that she could potentially stay with, including Kirtland Bouchard.  CSW asked her to contact friends to make sure of this and CSW will contact husband.  CSW attempted to call Chelsea Primus, no answer, mailbox full. Winferd Humphrey, MSW, LCSW Clinical Social Worker 08/27/2018 9:18 AM   CSW spoke with pt again.  She had spoken to Jenny Reichmann who had again told her there is no 50B and that she is welcome to come home.  CSW told her about my conversation with Kirtland Bouchard and pt is aware that [facility] home is an option if the need arises. Winferd Humphrey, MSW, LCSW Clinical Social Worker 08/27/2018 11:41 AM

## 2018-08-27 NOTE — BHH Suicide Risk Assessment (Signed)
[facility] Discharge Suicide Risk Assessment   Principal Problem: Bipolar disorder [facility]) Discharge Diagnoses: Principal Problem:   Bipolar disorder ([place])   Total Time spent with patient: 45 minutes  Musculoskeletal: Strength & Muscle Tone: within normal limits Gait & Station: normal Patient leans: N/A  Psychiatric Specialty Exam: ROS  Blood pressure (!) 127/91, pulse (!) 109, temperature 97.9 F (36.6 C), temperature source Oral, resp. rate 16, SpO2 100 %.There is no height or weight on file to calculate BMI.  General Appearance: Casual  Eye Contact::  Good  Speech:  Clear and Coherent409  Volume:  Normal  Mood:  Euthymic  Affect:  Congruent and Restricted  Thought Process:  Goal Directed and Descriptions of Associations: Circumstantial  Orientation:  Full (Time, Place, and Person)  Thought Content:  Tangential  Suicidal Thoughts:  No  Homicidal Thoughts:  No  Memory:  Immediate;   Fair  Judgement:  Fair  Insight:  Lacking re 50-B  Psychomotor Activity:  Normal  Concentration:  Good  Recall:  Good  Fund of Knowledge:Fair  Language: Fair  Akathisia:  Negative  Handed:  Right  AIMS (if indicated):     Assets:  Communication Skills Desire for Improvement  Sleep:  Number of Hours: 5.75  Cognition: WNL  ADL's:  Intact   Mental Status Per Nursing Assessment::   On Admission:  NA  Demographic Factors:  Low socioeconomic status  Loss Factors: Decrease in vocational status  Historical Factors: Impulsivity  Risk Reduction Factors:   Sense of responsibility to family and Religious beliefs about death  Continued Clinical Symptoms:  More than one psychiatric diagnosis  Cognitive Features That Contribute To Risk:  Thought constriction (tunnel vision)    Suicide Risk:  Minimal: No identifiable suicidal ideation.  Patients presenting with no risk factors but with morbid ruminations; may be classified as minimal risk based on the severity of the depressive  symptoms  Follow-up Information    [facility] Follow up.   Specialty: Family Medicine Contact information: [address] [place] [address] [phone]           Plan Of Care/Follow-up recommendations:  Activity:  full  Tyishia Aune, MD 08/27/2018, 8:57 AM

## 2018-08-27 NOTE — Progress Notes (Signed)
Recreation Therapy Notes  Date:  6.29.20 Time: 0930 Location: [address] Dayroom  Group Topic: Stress Management  Goal Area(s) Addresses:  Patient will identify positive stress management techniques. Patient will identify benefits of using stress management post d/c.  Behavioral Response: Engaged  Intervention: Stress Management  Activity :  Meditation.  LRT introduced the stress management technique of meditation.  LRT played a meditation that focused on taking on the stillness of the mountain.  Education:  Stress Management, Discharge Planning.   Education Outcome: Acknowledges Education  Clinical Observations/Feedback:  Pt attended and participated in group.    Hibba Schram, LRT/CTRS         Charlissa Petros A 08/27/2018 10:45 AM 

## 2020-06-03 ENCOUNTER — Ambulatory Visit (INDEPENDENT_AMBULATORY_CARE_PROVIDER_SITE_OTHER): Payer: Medicaid Other | Admitting: Clinical

## 2020-06-03 ENCOUNTER — Other Ambulatory Visit: Payer: Self-pay

## 2020-06-03 DIAGNOSIS — F250 Schizoaffective disorder, bipolar type: Secondary | ICD-10-CM

## 2020-06-07 NOTE — Progress Notes (Signed)
Comprehensive Clinical Assessment (CCA) Note  06/03/2020 Deborah Arnold [id number]  Chief Complaint:  Chief Complaint  Patient presents with  . Depression  . Anxiety  . Hallucinations   Visit Diagnosis:   Schizoaffective disorder, bipolar type  Interpretive Summary:   Client is a 50 yar old female presenting to [facility] as a walk in for outpatient behavioral health services. Client reported she presents by referral of another [facility] facility for a clinical assessment. Client reported a history of schizoaffective disorder, bipolar disorder, depression, anxiety, and PTSD. Client reported she was previous treated inpatient at [facility] for reported mental health diagnosis. Therapist reviewed the clients MR and confirmed in Jul 07, 2015 and 07-Jul-2018 the client was treated inpatient for mental health reasons. Client reported she has not taken psychotropic medications since 07/07/2018. Client reported her depression worsened in Jul 07, 2018 when her husband passed away and she is raising her three children on her own. Client endorses depressed mood, auditory hallucinations, irritability, insomnia, excessive worry, and mood swings. Client stated, "I have a voice talking to me all the time". Client reported her previous medications include Fluoxetine, gabapentin, and Abilify. Client reported her current stressor include social services involvement for her children. Client reported her protective factors are her children although she has no family support.  Client presented oriented times five, appropriately dressed, and cooperative. Client reported hallucinations and delusions. Client denied suicidal and homicidal ideations. Client appeared to respond to internal stimuli evidenced by responding to voices before answering the therapist questions during the assessment.  Client was screened for nutritional, pain, [place] suicide severity scale and the following SDOH: GAD 7 : Generalized Anxiety Score 06/03/2020  Nervous,  Anxious, on Edge 3  Control/stop worrying 3  Worry too much - different things 3  Trouble relaxing 2  Restless 3  Easily annoyed or irritable 2  Afraid - awful might happen 2  Total GAD 7 Score 18  Anxiety Difficulty Somewhat difficult   Flowsheet Row Counselor from 06/03/2020 in [facility]  PHQ-9 Total Score 12     Flowsheet Row Counselor from 06/03/2020 in [facility]  PHQ-2 Total Score 2      Treatment recommendations: Psychiatric evaluation and medication management. Client declinded individual therapy at this time.  Therapist provided information on format of appointment (virtual or face to face). The client was advised to call back or seek an in-person evaluation if the symptoms worsen or if the condition fails to improve as anticipated before the next scheduled appointment. Client was in agreement with treatment recommendations.     CCA Biopsychosocial Intake/Chief Complaint:  Client reported she is presenting by referral of another [place] facility to address ongoing symptoms of depression, bipolar disorder, schizoaffective disorder, and PTSD.  Current Symptoms/Problems: Client reported depressed mood, anxiety, panic attacks, irritability, mood swings, and auditory hallucinations.   Patient Reported Schizophrenia/Schizoaffective Diagnosis in Past: Yes  Type of Services Patient Feels are Needed: Psychiatric evaluation and medication management   Initial Clinical Notes/Concerns: No data recorded  Mental Health Symptoms Depression:  Change in energy/activity; Irritability; Sleep (too much or little); Hopelessness; Tearfulness   Duration of Depressive symptoms: Greater than two weeks   Mania:  None   Anxiety:   Difficulty concentrating; Irritability; Tension; Worrying; Sleep   Psychosis:  None   Duration of Psychotic symptoms: No data recorded  Trauma:  Detachment from others   Obsessions:  None    Compulsions:  None   Inattention:  None  Hyperactivity/Impulsivity:  N/A   Oppositional/Defiant Behaviors:  None   Emotional Irregularity:  None   Other Mood/Personality Symptoms:  No data recorded   Mental Status Exam Appearance and self-care  Stature:  Average   Weight:  Average weight   Clothing:  Casual   Grooming:  Normal   Cosmetic use:  Age appropriate   Posture/gait:  Normal   Motor activity:  Not Remarkable   Sensorium  Attention:  Distractible   Concentration:  Preoccupied   Orientation:  X5   Recall/memory:  Defective in Remote   Affect and Mood  Affect:  Depressed   Mood:  Depressed   Relating  Eye contact:  Avoided   Facial expression:  Depressed   Attitude toward examiner:  Cooperative   Thought and Language  Speech flow: Soft   Thought content:  Appropriate to Mood and Circumstances   Preoccupation:  None   Hallucinations:  Auditory   Organization:  No data recorded  [employer] of Knowledge:  Fair   Intelligence:  Average   Abstraction:  Functional   Judgement:  Fair   Reality Testing:  Adequate   Insight:  Fair   Decision Making:  Only simple   Social Functioning  Social Maturity:  Isolates   Social Judgement:  Normal   Stress  Stressors:  Family conflict; Financial; Other (Comment) (open CPS case with social services)   Coping Ability:  Overwhelmed   Skill Deficits:  Activities of daily living; Communication   Supports:  Support needed     Religion: Religion/Spirituality Are You A Religious Person?: No  Leisure/Recreation: Leisure / Recreation Do You Have Hobbies?: No  Exercise/Diet: Exercise/Diet Do You Exercise?: No Have You Gained or Lost A Significant Amount of Weight in the Past Six Months?: No Do You Follow a Special Diet?: No Do You Have Any Trouble Sleeping?: Yes Explanation of Sleeping Difficulties: waking up during the night frequently   CCA  Employment/Education Employment/Work Situation: Employment / Work Situation Employment situation: Employed Where is patient currently employed?: [employer] How long has patient been employed?: 1 year  Education: Education Did Garment/textile technologist From [employer]?: Yes   CCA Family/Childhood History Family and Relationship History: Family history Marital status: Widowed Widowed, when?: Client reported her husband passed away a year ago. Client reported they were married for 18 years. Does patient have children?: Yes How many children?: 3 How is patient's relationship with their children?: Client reported she has a 17,68,and 64 year old children that she has a good relationship with. Client reported social services has been involved for a month now because someone called and said the home was not in good living conditions.  Childhood History:  Childhood History By whom was/is the patient raised?: Other (Comment) Additional childhood history information: Client reported she was in and out of foster homes quite a bit, but her aunt and uncle primary raised her. Client reported no relationship with her biological parents during childhood. Client reported she currently has somewhat of a relationship with her biological father. Does patient have siblings?: Yes Number of Siblings: 2 Description of patient's current relationship with siblings: Client reported 1 brother living and 1 brother is deceased Did patient suffer any verbal/emotional/physical/sexual abuse as a child?: No Did patient suffer from severe childhood neglect?: No Has patient ever been sexually abused/assaulted/raped as an adolescent or adult?: No Was the patient ever a victim of a crime or a disaster?: No Witnessed domestic violence?: No Has patient been affected by  domestic violence as an adult?: No  Child/Adolescent Assessment:     CCA Substance Use Alcohol/Drug Use: Alcohol / Drug Use History of alcohol / drug use?: No  history of alcohol / drug abuse                         ASAM's:  Six Dimensions of Multidimensional Assessment  Dimension 1:  Acute Intoxication and/or Withdrawal Potential:      Dimension 2:  Biomedical Conditions and Complications:      Dimension 3:  Emotional, Behavioral, or Cognitive Conditions and Complications:     Dimension 4:  Readiness to Change:     Dimension 5:  Relapse, Continued use, or Continued Problem Potential:     Dimension 6:  Recovery/Living Environment:     ASAM Severity Score:    ASAM Recommended Level of Treatment:     Substance use Disorder (SUD)    Recommendations for Services/Supports/Treatments: Recommendations for Services/Supports/Treatments Recommendations For Services/Supports/Treatments: Medication Management  DSM5 Diagnoses: Patient Active Problem List   Diagnosis Date Noted  . Bipolar disorder (HCC) 08/21/2018  . Major depressive disorder, recurrent episode, moderate (HCC) 01/01/2016  . Auditory hallucination     Patient Centered Plan: Patient is on the following Treatment Plan(s):  Depression   Referrals to Alternative Service(s): Referred to Alternative Service(s):   Place:   Date:   Time:    Referred to Alternative Service(s):   Place:   Date:   Time:    Referred to Alternative Service(s):   Place:   Date:   Time:    Referred to Alternative Service(s):   Place:   Date:   Time:     Loree Fee, LCSW

## 2021-05-28 ENCOUNTER — Ambulatory Visit (INDEPENDENT_AMBULATORY_CARE_PROVIDER_SITE_OTHER): Payer: Medicaid Other | Admitting: Physician Assistant

## 2021-05-28 ENCOUNTER — Encounter (HOSPITAL_COMMUNITY): Payer: Self-pay | Admitting: Physician Assistant

## 2021-05-28 VITALS — BP 154/95 | HR 73 | Ht 63.0 in | Wt 152.0 lb

## 2021-05-28 DIAGNOSIS — F250 Schizoaffective disorder, bipolar type: Secondary | ICD-10-CM | POA: Diagnosis not present

## 2021-05-28 DIAGNOSIS — F411 Generalized anxiety disorder: Secondary | ICD-10-CM | POA: Diagnosis not present

## 2021-05-28 MED ORDER — HYDROXYZINE PAMOATE 25 MG PO CAPS: 75.0000 mg | ORAL_CAPSULE | Freq: Three times a day (TID) | ORAL | 1 refills | Status: DC | PRN

## 2021-05-28 MED ORDER — GABAPENTIN 100 MG PO CAPS: 100.0000 mg | ORAL_CAPSULE | Freq: Three times a day (TID) | ORAL | 1 refills | Status: DC

## 2021-05-28 MED ORDER — ARIPIPRAZOLE 5 MG PO TABS: ORAL_TABLET | ORAL | 1 refills | Status: DC

## 2021-05-28 NOTE — Progress Notes (Signed)
Psychiatric Initial Adult Assessment  ? ?Patient Identification: Deborah BaldingShannon M Arnold ?MRN:  [id number] ?Date of Evaluation:  05/28/2021 ?Referral Source: Walk-in/establish psychiatric Care ?Chief Complaint:   ?Chief Complaint  ?Patient presents with  ? Medication Management  ? ?Visit Diagnosis:  ?  ICD-10-CM   ?1. Schizoaffective disorder, bipolar type (HCC)  F25.0 ARIPiprazole (ABILIFY) 5 MG tablet  ?  ?2. Anxiety state  F41.1 gabapentin (NEURONTIN) 100 MG capsule  ?  hydrOXYzine (VISTARIL) 25 MG capsule  ?  ? ? ?History of Present Illness:   ? ?Deborah Arnold is a 51 year old female with a past psychiatric history significant for schizoaffective (bipolar type) [facility] care and for medication management. ? ?Patient reports that she was diagnosed with schizoaffective (bipolar type) and PTSD by a provider at the facility and she is requesting medications.  Patient reports that she was taking the following medications in the past: Fluoxetine and gabapentin.  Patient is unsure if the medications were helpful stating that someone was putting voices in the pills that she was taking.  Patient reports that she has been hearing voices for several years.  Patient then stated "too many damn predators, tired of predators."  Patient then stated "I hear voices that people are using me, I do not like it, I do not like it when bit."  She then stated, "They call me a whore.  Negative, negative, negatve. I don't want the sperm on it, I do not want to clit."  Patient then stated that the voices say "Tis mine now."  Patient states that she is in charge of her own body parts and is tired of being predatorized. ? ?Patient says she sees silhouettes of people left behind.  Patient states that she can see the writer all over the office where I have and imaged myself out.  Patient endorses depression characterized by the following symptoms: feelings of  sadness, feelings of guilt/worthlessness, hopelessness, and decreased concentration.  Patient denies lack of motivation and decreased energy.  Patient further endorses hyperactivity, racing thoughts, irritability, and mood swings.  Patient also endorses anxiety and rates her anxiety at 10 out of 10.  Triggers to her anxiety include hearing voices of people trying to "predatorize her." ? ?Patient endorses past history of hospitalization due to mental health and states that she was hospitalized for 7 days after her husband stated that she needed to go.  At the time, patient was accused of using alcohol and cocaine but states they found that in her system when assessed in the hospital.  Patient reports that she attempted suicide years ago and states that she has a past history of self-injurious behavior characterized by slicing her wrists.  Patient states that the self-injurious behavior occurred years ago.  A PHQ-9 screen was performed with the patient scoring an 8.  A GAD-7 screen was also performed with the patient scoring a 13. ? ?Patient is alert and oriented x4, calm, cooperative, and fully engaged in conversation during the counter.  Patient denies suicidal or homicidal ideations.  She further denies auditory or visual hallucinations; however, patient is noted talking to herself throughout the encounter.  Patient endorses poor sleep and states that she receives on average 4 hours of sleep each night characterized by getting up and down all throughout the night.  Patient endorses decreased appetite.  Patient denies current alcohol consumption but states that she drank last year.  Patient denies tobacco use.  Patient  further denies illicit drug use but states that she used cocaine in the past. ? ?Associated Signs/Symptoms: ?Depression Symptoms:  depressed mood, ?insomnia, ?psychomotor agitation, ?psychomotor retardation, ?fatigue, ?feelings of worthlessness/guilt, ?difficulty  concentrating, ?hopelessness, ?anxiety, ?panic attacks, ?loss of energy/fatigue, ?disturbed sleep, ?weight loss, ?decreased appetite, ?(Hypo) Manic Symptoms:  Delusions, ?Distractibility, ?Elevated Mood, ?Flight of Ideas, ?Hallucinations, ?Impulsivity, ?Irritable Mood, ?Labiality of Mood, ?Anxiety Symptoms:  Agoraphobia, ?Excessive Worry, ?Panic Symptoms, ?Social Anxiety, ?Psychotic Symptoms:  Delusions, ?Hallucinations: Auditory ?Visual ?Paranoia, ?PTSD Symptoms: ?Had a traumatic exposure:  Patient report that she was in and out of foster homes growing up. ?Had a traumatic exposure in the last month:  N/A ?Re-experiencing:  Intrusive Thoughts ?Nightmares ?Hypervigilance:  Yes ?Hyperarousal:  Difficulty Concentrating ?Emotional Numbness/Detachment ?Irritability/Anger ?Sleep ?Avoidance:  Decreased Interest/Participation ?Foreshortened Future ? ?Past Psychiatric History:  ?Vida Rigger ? ?Previous Psychotropic Medications: Yes  ? ?Substance Abuse History in the last 12 months:  No. ? ?Consequences of Substance Abuse: ?Medical Consequences:  Patient denies a past history of hospitalization due to substance abuse ?Legal Consequences:  Patient states that he was charged with possession in the past ?Family Consequences:  None ?Blackouts:  None ?DT's: None ?Withdrawal Symptoms:   None ? ?Past Medical History:  ?Past Medical History:  ?Diagnosis Date  ? Abortion   ? x2  ? Family history of anesthesia complication   ? " my son has malignant hypothermia "  ? Preterm labor   ? Urinary tract infection   ?  ?Past Surgical History:  ?Procedure Laterality Date  ? INDUCED ABORTION    ? x2  ? KNEE CARTILAGE SURGERY  1/08  ? ORIF ANKLE FRACTURE Right 08/28/2012  ? Dr Lajoyce Corners  ? ORIF ANKLE FRACTURE Right 08/28/2012  ? Procedure: OPEN REDUCTION INTERNAL FIXATION (ORIF) RIGHT TRIMALEOLAR  ANKLE FRACTURE;  Surgeon: Nadara Mustard, MD;  Location: [facility] OR;  Service: Orthopedics;  Laterality: Right;  ? TUBAL LIGATION Bilateral 05/18/2012  ? Procedure:  POST PARTUM TUBAL LIGATION;  Surgeon: Adam Phenix, MD;  Location: [facility] ORS;  Service: Gynecology;  Laterality: Bilateral;  ? ? ?Family Psychiatric History:  ?Patient denies a family history of past psychiatric illness ? ?Family History:  ?Family History  ?Problem Relation Age of Onset  ? Cancer Other   ?     Breast-maternal side  ? ? ?Social History:   ?Social History  ? ?Socioeconomic History  ? Marital status: Divorced  ?  Spouse name: Not on file  ? Number of children: Not on file  ? Years of education: Not on file  ? Highest education level: Not on file  ?Occupational History  ? Not on file  ?Tobacco Use  ? Smoking status: Some Days  ?  Packs/day: 0.50  ?  Years: 26.00  ?  Pack years: 13.00  ?  Types: Cigarettes  ? Smokeless tobacco: Never  ?Substance and Sexual Activity  ? Alcohol use: Yes  ?  Alcohol/week: 1.0 standard drink  ?  Types: 1 Cans of beer per week  ?  Comment: "1 beer a week for constipation" per patient-in early pregnancy  ? Drug use: Yes  ?  Types: Marijuana  ? Sexual activity: Yes  ?  Birth control/protection: None  ?Other Topics Concern  ? Not on file  ?Social History Narrative  ? Not on file  ? ?Social Determinants of Health  ? ?Financial Resource Strain: Not on file  ?Food Insecurity: Not on file  ?Transportation Needs: Not on file  ?Physical Activity: Not on file  ?  Stress: Not on file  ?Social Connections: Not on file  ? ? ?Additional Social History:  ?Patient reports that the previous position she was working at with closed and she has not been working since October.  Patient denies having any social support but states that she does have grandchildren.  Patient endorses transportation and is currently working on a permanent housing situation. ? ?Allergies:  No Known Allergies ? ?Metabolic Disorder Labs: ?Lab Results  ?Component Value Date  ? HGBA1C 5.0 08/24/2018  ? MPG 96.8 08/24/2018  ? ?No results found for: PROLACTIN ?Lab Results  ?Component Value Date  ? CHOL 159 08/24/2018  ? TRIG 87  08/24/2018  ? HDL 51 08/24/2018  ? CHOLHDL 3.1 08/24/2018  ? VLDL 17 08/24/2018  ? LDLCALC 91 08/24/2018  ? ?Lab Results  ?Component Value Date  ? TSH 0.352 08/24/2018  ? ? ?Therapeutic Level Labs: ?No results found for: LITHIUM ?No results found

## 2021-07-13 ENCOUNTER — Encounter (HOSPITAL_COMMUNITY): Payer: Medicaid Other | Admitting: Physician Assistant

## 2021-07-13 ENCOUNTER — Telehealth (HOSPITAL_COMMUNITY): Payer: Self-pay | Admitting: Physician Assistant

## 2021-07-13 NOTE — Telephone Encounter (Signed)
CPS worker [employer], called and would like to speak. [phone] ?

## 2021-08-10 ENCOUNTER — Telehealth (INDEPENDENT_AMBULATORY_CARE_PROVIDER_SITE_OTHER): Payer: Medicaid Other | Admitting: Physician Assistant

## 2021-08-10 ENCOUNTER — Encounter (HOSPITAL_COMMUNITY): Payer: Self-pay | Admitting: Physician Assistant

## 2021-08-10 DIAGNOSIS — R0689 Other abnormalities of breathing: Secondary | ICD-10-CM | POA: Diagnosis not present

## 2021-08-10 DIAGNOSIS — F250 Schizoaffective disorder, bipolar type: Secondary | ICD-10-CM | POA: Diagnosis not present

## 2021-08-10 DIAGNOSIS — F411 Generalized anxiety disorder: Secondary | ICD-10-CM

## 2021-08-10 MED ORDER — ARIPIPRAZOLE 10 MG PO TABS: 10.0000 mg | ORAL_TABLET | Freq: Every day | ORAL | 2 refills | Status: DC

## 2021-08-10 MED ORDER — GABAPENTIN 100 MG PO CAPS: 100.0000 mg | ORAL_CAPSULE | Freq: Every evening | ORAL | 2 refills | Status: DC

## 2021-08-10 MED ORDER — ALBUTEROL SULFATE HFA 108 (90 BASE) MCG/ACT IN AERS: 1.0000 | INHALATION_SPRAY | Freq: Four times a day (QID) | RESPIRATORY_TRACT | 0 refills | Status: DC | PRN

## 2021-08-10 MED ORDER — HYDROXYZINE HCL 10 MG PO TABS: 10.0000 mg | ORAL_TABLET | Freq: Three times a day (TID) | ORAL | 2 refills | Status: DC | PRN

## 2021-08-10 NOTE — Progress Notes (Signed)
BH MD/PA/NP OP Progress Note  Virtual Visit via Telephone Note  I connected with Deborah Arnold on 08/13/21 at  3:30 PM EDT by telephone and verified that I am speaking with the correct person using two identifiers.  Location: Patient: Home Provider: Clinic   I discussed the limitations, risks, security and privacy concerns of performing an evaluation and management service by telephone and the availability of in person appointments. I also discussed with the patient that there may be a patient responsible charge related to this service. The patient expressed understanding and agreed to proceed.  Follow Up Instructions:  I discussed the assessment and treatment plan with the patient. The patient was provided an opportunity to ask questions and all were answered. The patient agreed with the plan and demonstrated an understanding of the instructions.   The patient was advised to call back or seek an in-person evaluation if the symptoms worsen or if the condition fails to improve as anticipated.  I provided 17 minutes of non-face-to-face time during this encounter.  Meta Hatchet, PA   08/13/2021 5:47 PM Deborah Arnold  MRN:  [id number]  Chief Complaint:  Chief Complaint  Patient presents with   Follow-up   Medication Refill   HPI:   Deborah Arnold. [name] is a 51 year old female with a past psychiatric history significant for anxiety and schizoaffective disorder (bipolar type) who presents to [facility] for follow-up and medication management.  Patient is currently being managed on the following medications:  Gabapentin 100 mg 3 times daily Hydroxyzine 25 mg 3 times daily as needed Abilify 10 mg daily  Patient states that her gabapentin has been helpful and that it works well with her Abilify usage.  Patient still continues to endorse depressive symptoms stating that she experiences 2 depressive episodes per week.  Patient states that her  depression depends on the situation impacting her.  She reports that she may have to move out of her current location which makes her worry.  Although she is worried, patient states that her medications prevent her from losing it.  Patient reports that she has noticed a positive and favorable difference in her mood.  Patient endorses elevated anxiety on occasion.  Patient continues to endorse sluggishness and fatigue.  She reports that she tries taking a walk or watching her face to help alleviate her fatigue, but no success.  Patient's current stressors include homelessness and her Deborah Arnold currently being broken down.  Patient feels helpless and occasionally wishes that she did not wake up.  A GAD-7 screen was performed with the patient scoring 16.  Patient is alert and oriented x4, calm, cooperative, and fully engaged in conversation during the encounter.  Patient endorses pretty good mood.  Patient denies suicidal or homicidal ideations.  She further denies auditory or visual hallucinations and does not appear to be responding to internal/external stimuli.  Patient reports not receiving much sleep and often will take naps in the morning.  Patient endorses good appetite and eats on average 3 meals per day.  Patient endorses alcohol consumption on occasion.  Patient denies tobacco use and illicit drug use.  Visit Diagnosis:    ICD-10-CM   1. Breathing difficulty  R06.89 albuterol (VENTOLIN HFA) 108 (90 Base) MCG/ACT inhaler    2. Anxiety state  F41.1 gabapentin (NEURONTIN) 100 MG capsule    3. Schizoaffective disorder, bipolar type (HCC)  F25.0 ARIPiprazole (ABILIFY) 10 MG tablet      Past Psychiatric History:  Schizoaffective disorder (bipolar type)  Past Medical History:  Past Medical History:  Diagnosis Date   Abortion    x2   Family history of anesthesia complication    " my son has malignant hypothermia "   Preterm labor    Urinary tract infection     Past Surgical History:  Procedure  Laterality Date   INDUCED ABORTION     x2   KNEE CARTILAGE SURGERY  1/08   ORIF ANKLE FRACTURE Right 08/28/2012   Deborah Arnold   ORIF ANKLE FRACTURE Right 08/28/2012   Procedure: OPEN REDUCTION INTERNAL FIXATION (ORIF) RIGHT TRIMALEOLAR  ANKLE FRACTURE;  Surgeon: Deborah MustardMarcus V [name], MD;  Location: [facility] OR;  Service: Orthopedics;  Laterality: Right;   TUBAL LIGATION Bilateral 05/18/2012   Procedure: POST PARTUM TUBAL LIGATION;  Surgeon: Deborah PhenixJames G Arnold, MD;  Location: [facility] ORS;  Service: Gynecology;  Laterality: Bilateral;    Family Psychiatric History:  Patient denies a family history of past psychiatric illness  Family History:  Family History  Problem Relation Age of Onset   Cancer Other        Breast-maternal side    Social History:  Social History   Socioeconomic History   Marital status: Divorced    Spouse name: Not on file   Number of children: Not on file   Years of education: Not on file   Highest education level: Not on file  Occupational History   Not on file  Tobacco Use   Smoking status: Some Days    Packs/day: 0.50    Years: 26.00    Total pack years: 13.00    Types: Cigarettes   Smokeless tobacco: Never  Substance and Sexual Activity   Alcohol use: Yes    Alcohol/week: 1.0 standard drink of alcohol    Types: 1 Cans of beer per week    Comment: "1 beer a week for constipation" per patient-in early pregnancy   Drug use: Yes    Types: Marijuana   Sexual activity: Yes    Birth control/protection: None  Other Topics Concern   Not on file  Social History Narrative   Not on file   Social Determinants of Health   Financial Resource Strain: Not on file  Food Insecurity: Not on file  Transportation Needs: Not on file  Physical Activity: Not on file  Stress: Not on file  Social Connections: Not on file    Allergies: No Known Allergies  Metabolic Disorder Labs: Lab Results  Component Value Date   HGBA1C 5.0 08/24/2018   MPG 96.8 08/24/2018   No results found  for: "PROLACTIN" Lab Results  Component Value Date   CHOL 159 08/24/2018   TRIG 87 08/24/2018   HDL 51 08/24/2018   CHOLHDL 3.1 08/24/2018   VLDL 17 08/24/2018   LDLCALC 91 08/24/2018   Lab Results  Component Value Date   TSH 0.352 08/24/2018    Therapeutic Level Labs: No results found for: "LITHIUM" No results found for: "VALPROATE" No results found for: "CBMZ"  Current Medications: Current Outpatient Medications  Medication Sig Dispense Refill   hydrOXYzine (ATARAX) 10 MG tablet Take 1 tablet (10 mg total) by mouth 3 (three) times daily as needed. 75 tablet 2   albuterol (VENTOLIN HFA) 108 (90 Base) MCG/ACT inhaler Inhale 1-2 puffs into the lungs every 6 (six) hours as needed for wheezing or shortness of breath. 6.7 g 0   ARIPiprazole (ABILIFY) 10 MG tablet Take 1 tablet (10 mg total) by mouth daily. 30  tablet 2   ARIPiprazole ER (ABILIFY MAINTENA) 400 MG SRER injection Inject 2 mLs (400 mg total) into the muscle every 28 (twenty-eight) days. 1 each 11   FLUoxetine (PROZAC) 20 MG capsule Take 1 capsule (20 mg total) by mouth daily. 90 capsule 3   gabapentin (NEURONTIN) 100 MG capsule Take 1 capsule (100 mg total) by mouth every evening. 30 capsule 2   No current facility-administered medications for this visit.     Musculoskeletal: Strength & Muscle Tone: Unable to assess due to telemedicine visit Gait & Station: Unable to assess due to telemedicine visit Patient leans: Unable to assess due to telemedicine visit  Psychiatric Specialty Exam: Review of Systems  Psychiatric/Behavioral:  Positive for sleep disturbance. Negative for decreased concentration, dysphoric mood, hallucinations, self-injury and suicidal ideas. The patient is nervous/anxious. The patient is not hyperactive.     There were no vitals taken for this visit.There is no height or weight on file to calculate BMI.  General Appearance: Unable to assess due to telemedicine visit  Eye Contact:  Unable to  assess due to telemedicine visit  Speech:  Clear and Coherent and Normal Rate  Volume:  Normal  Mood:  Anxious and Depressed  Affect:  Congruent  Thought Process:  Coherent, Goal Directed, and Descriptions of Associations: Intact  Orientation:  Full (Time, Place, and Person)  Thought Content: WDL   Suicidal Thoughts:  No  Homicidal Thoughts:  No  Memory:  Immediate;   Good Recent;   Good Remote;   Fair  Judgement:  Fair  Insight:  Fair  Psychomotor Activity:  Normal  Concentration:  Concentration: Fair and Attention Span: Fair  Recall:  [employer] of Knowledge: Fair  Language: Good  Akathisia:  No  Handed:  Right  AIMS (if indicated): not done  Assets:  Communication Skills Desire for Improvement Transportation  ADL's:  Intact  Cognition: WNL  Sleep:  Poor   Screenings: AIMS    Flowsheet Row Ad[facility] (Discharged) [facility]  AIMS Total Score 0      AUDIT    Flowsheet Row Admission (Discharged) [facility]  Alcohol Use Disorder Identification Test Final Score (AUDIT) 4      GAD-7    Flowsheet Row Video Visit from 08/10/2021 in [facility] Office Visit from 05/28/2021 in [facility] Counselor from 06/03/2020 in [facility]  Total GAD-7 Score 16 13 18       PHQ2-9    Flowsheet Row Video Visit from 08/10/2021 in [facility] Office Visit from 05/28/2021 in [facility] Counselor from 06/03/2020 in [place]  PHQ-2 Total Score 1 2 2   PHQ-9 Total Score -- 8 12      Flowsheet Row Video Visit from 08/10/2021 in [facility] Office Visit from 05/28/2021 in [facility] Counselor from 06/03/2020 in [facility]  C-SSRS RISK CATEGORY Low Risk Low Risk No Risk        Assessment and Plan:   Johan Antonacci. Sugarman is a 51 year old female with a past psychiatric history significant for anxiety and schizoaffective disorder (bipolar type) who presents to [facility] for follow-up and medication management.  Notes improvements in her mood since taking Abilify and gabapentin.  Although  patient endorses improvements in her mental health, she continues to experience depression and anxiety due to related to stressors in her life.  Patient also notes that she has been having issues with sleep.  She reports that her hydroxyzine occasionally makes her feel drowsy.  Patient was recommended decreasing her dosage of hydroxyzine from 25 mg to 10 mg 3 times daily as needed for the management of her anxiety.  Patient was also recommended trazodone 50 mg at bedtime for the management of her sleep.  Patient was agreeable to recommendations.  Collaboration of Care: Collaboration of Care: Medication Management AEB provider managing patient's psychiatric medications and Psychiatrist AEB patient being followed by mental health provider  Patient/Guardian was advised Release of Information must be obtained prior to any record release in order to collaborate their care with an outside provider. Patient/Guardian was advised if they have not already done so to contact the registration department to sign all necessary forms in order for [place] to release information regarding their care.   Consent: Patient/Guardian gives verbal consent for treatment and assignment of benefits for services provided during this visit. Patient/Guardian expressed understanding and agreed to proceed.   1. Breathing difficulty  - albuterol (VENTOLIN HFA) 108 (90 Base) MCG/ACT inhaler; Inhale 1-2 puffs into the lungs every 6 (six) hours as needed for wheezing or shortness of breath.  Dispense: 6.7 g;  Refill: 0  2. Anxiety state  - gabapentin (NEURONTIN) 100 MG capsule; Take 1 capsule (100 mg total) by mouth every evening.  Dispense: 30 capsule; Refill: 2  3. Schizoaffective disorder, bipolar type (HCC)  - ARIPiprazole (ABILIFY) 10 MG tablet; Take 1 tablet (10 mg total) by mouth daily.  Dispense: 30 tablet; Refill: 2  Patient to follow up in 6 weeks Provider spent a total of 17 minutes with the patient/reviewing patient's chart  Meta Hatchet, PA 08/13/2021, 5:47 PM

## 2021-08-17 ENCOUNTER — Telehealth (HOSPITAL_COMMUNITY): Payer: Self-pay | Admitting: *Deleted

## 2021-08-17 NOTE — Telephone Encounter (Signed)
Received a request via fax on patients Albuterol, spoke with her pharmacy and it can be changed to Ventolin and it ran through.

## 2021-08-17 NOTE — Telephone Encounter (Signed)
Opened second time in error

## 2021-09-14 ENCOUNTER — Telehealth (INDEPENDENT_AMBULATORY_CARE_PROVIDER_SITE_OTHER): Payer: Medicaid Other | Admitting: Psychiatry

## 2021-09-14 ENCOUNTER — Telehealth (HOSPITAL_COMMUNITY): Payer: Medicaid Other | Admitting: Physician Assistant

## 2021-09-14 DIAGNOSIS — R0689 Other abnormalities of breathing: Secondary | ICD-10-CM

## 2021-09-14 DIAGNOSIS — F411 Generalized anxiety disorder: Secondary | ICD-10-CM

## 2021-09-14 DIAGNOSIS — F250 Schizoaffective disorder, bipolar type: Secondary | ICD-10-CM

## 2021-09-14 MED ORDER — GABAPENTIN 100 MG PO CAPS: 100.0000 mg | ORAL_CAPSULE | Freq: Every evening | ORAL | 2 refills | Status: DC

## 2021-09-14 MED ORDER — FLUOXETINE HCL 20 MG PO CAPS: 20.0000 mg | ORAL_CAPSULE | Freq: Every day | ORAL | 2 refills | Status: DC

## 2021-09-14 MED ORDER — ALBUTEROL SULFATE HFA 108 (90 BASE) MCG/ACT IN AERS: 1.0000 | INHALATION_SPRAY | Freq: Four times a day (QID) | RESPIRATORY_TRACT | 1 refills | Status: DC | PRN

## 2021-09-14 NOTE — Progress Notes (Signed)
Patient ID: Deborah Arnold, female   DOB: 02/07/1971, 51 y.o.   MRN: [id number]  Virtual Visit via Telephone Note  I connected with Deborah Arnold on 09/14/21 at  1:00 PM EDT by telephone and verified that I am speaking with the correct person using two identifiers.  Location: Patient: Home Provider: Clinic   I discussed the limitations, risks, security and privacy concerns of performing an evaluation and management service by telephone and the availability of in person appointments. I also discussed with the patient that there may be a patient responsible charge related to this service. The patient expressed understanding and agreed to proceed.   History of Present Illness:Deborah Arnold is a 51 year old female with a past psychiatric history significant for anxiety and schizoaffective disorder (bipolar type) who presents to [facility] for follow-up via telephone for medication management.  Subjective: Patient states her mood has been good and reports improvement in her depression and anxiety.  She reports that yesterday was her birthday and couple of her friends came to visit her.  She is using her coping skills including playing games with her kids, coloring, and watching cartoons with them.  She reports no change in her current stressors.  She states that she has been having difficulty in getting food stamps and her Zenaida Niece is still broken. She reports that she has been sleeping better than before and on average sleeps for 5-6 hours .  She reports that sometimes she wakes up in the middle of night due to racing thoughts. She reports good appetite and is trying to eat healthy.  Currently, she denies any active or passive SI, HI, AVH.  She endorses ruminations , states everything keeps on playing in her head again and again.  She reports variable energy and good concentration.  She reports that she has not been taking her Abilify regularly and sometimes takes  half tablet (5 mg) instead of 10 mg.  Discuss that she should take Abilify 10 mg daily and should not decrease the dose.  She verbalizes understanding.  She drinks 40 ounce beer once a week and uses marijuana about twice per week.  Recommended complete cessation of marijuana.  Discussed risks including psychosis.  Patient verbalizes understanding.  She smokes about 5 cigarettes a day.  She is currently unemployed and lives with her 3 children (52, 92, 106) in a motel.  She has been tolerating her medications well without any side effects.  She wants to continue the same medications and denies any need for change of dose.  Assessment and Plan:Deborah Arnold is a 51 year old female with a past psychiatric history significant for anxiety and schizoaffective disorder (bipolar type) who presents to [facility] for follow-up and medication management.   1. Breathing difficulty   - albuterol (VENTOLIN HFA) 108 (90 Base) MCG/ACT inhaler; Inhale 1-2 puffs into the lungs every 6 (six) hours as needed for wheezing or shortness of breath.  Dispense: 6.7 g; Refill: 1   2. Anxiety state   - gabapentin (NEURONTIN) 100 MG capsule; Take 1 capsule (100 mg total) by mouth every evening.  Dispense: 30 capsule; Refill: 2   3. Schizoaffective disorder, bipolar type (HCC)   - ARIPiprazole (ABILIFY) 10 MG tablet; Take 1 tablet (10 mg total) by mouth daily.  Patient already has refills  -Continue Prozac 20 mg daily. Dispense: 30 capsule; Refill: 2  Follow Up Instructions:  Patient to follow up in 8 weeks.  Provider spent a total of 15 minutes with the patient/reviewing patient's chart  Collaboration of Care: Collaboration of Care: Medication Management AEB provider managing patient's psychiatric medications and Psychiatrist AEB patient being followed by mental health provider   Patient/Guardian was advised Release of Information must be obtained prior to any record release in  order to collaborate their care with an outside provider. Patient/Guardian was advised if they have not already done so to contact the registration department to sign all necessary forms in order for [place] to release information regarding their care.    Consent: Patient/Guardian gives verbal consent for treatment and assignment of benefits for services provided during this visit. Patient/Guardian expressed understanding and agreed to proceed.   I discussed the assessment and treatment plan with the patient. The patient was provided an opportunity to ask questions and all were answered. The patient agreed with the plan and demonstrated an understanding of the instructions.   The patient was advised to call back or seek an in-person evaluation if the symptoms worsen or if the condition fails to improve as anticipated.  I provided 15 minutes of non-face-to-face time during this encounter.   Karsten Ro, MD

## 2021-11-08 ENCOUNTER — Telehealth (HOSPITAL_COMMUNITY): Payer: Self-pay | Admitting: *Deleted

## 2021-11-08 NOTE — Telephone Encounter (Signed)
PATIENT"S CPS case worker called to see if she has been coming to her appt's  & then stated patient also needs  SAIOP

## 2021-11-10 ENCOUNTER — Ambulatory Visit (HOSPITAL_COMMUNITY): Payer: Medicaid Other | Admitting: Psychiatry

## 2021-11-10 NOTE — Telephone Encounter (Signed)
Clinician contacted client on 11/10/2021 at 8:32am at [phone] and left voicemail for client with callback number.

## 2021-11-24 ENCOUNTER — Telehealth (HOSPITAL_BASED_OUTPATIENT_CLINIC_OR_DEPARTMENT_OTHER): Payer: Medicaid Other | Admitting: Psychiatry

## 2021-11-24 DIAGNOSIS — F250 Schizoaffective disorder, bipolar type: Secondary | ICD-10-CM

## 2021-11-24 DIAGNOSIS — F411 Generalized anxiety disorder: Secondary | ICD-10-CM

## 2021-11-24 MED ORDER — ARIPIPRAZOLE 10 MG PO TABS: 10.0000 mg | ORAL_TABLET | Freq: Every day | ORAL | 2 refills | Status: DC

## 2021-11-24 MED ORDER — GABAPENTIN 100 MG PO CAPS: 100.0000 mg | ORAL_CAPSULE | Freq: Every evening | ORAL | 2 refills | Status: DC

## 2021-11-24 NOTE — Progress Notes (Addendum)
Patient ID: Deborah Arnold, female   DOB: [birth year], 51 y.o.   MRN: [id number]  Virtual Visit via Telephone Note  I connected with Deborah Arnold on 11/24/21 at  1:30 PM EDT by telephone and verified that I am speaking with the correct person using two identifiers.  Location: Patient: Home Provider: Clinic   I discussed the limitations, risks, security and privacy concerns of performing an evaluation and management service by telephone and the availability of in person appointments. I also discussed with the patient that there may be a patient responsible charge related to this service. The patient expressed understanding and agreed to proceed.   History of Present Illness:Deborah Arnold is a 50 year old female with a past psychiatric history significant for anxiety and schizoaffective disorder (bipolar type) who presents to [facility] for follow-up via telephone for medication management.  Subjective: Patient states her mood is "ok" and has been stable. She reports no change in her current stressors.  She uses her coping skills to keep herself busy and listen to music, watches movies and comedies.  She reports that she does not sleep well at night and wakes up multiple times but feels drowsy during the daytime. She reports stable appetite. Currently, she denies any active or passive SI, HI, AVH.  She reports ruminations and states that her negative thoughts plays in her head over and over again.  She reports that these are derogatory remarks which other people had made about her in the past.  She reports that her husband died 2-1/2 years ago and she has started dating again recently which is causing her more anxiety.  She has been taking her medications regularly and tolerating them well.  She is currently unemployed and lives with her 3 children (38, 68, 61) in a motel.  Discussed increase in Abilify to help with ruminations.  She wants to continue the same  medications and denies any need for change of dose.  Discussed that she can take Abilify at night to reduce sedation during the daytime.  Patient has not been taking Prozac and does not want to restart it.  Psychiatric Specialty Exam:   Review of Systems  There were no vitals taken for this visit.There is no height or weight on file to calculate BMI.  General Appearance: Not able to assess due to phone visit  Eye Contact:  Not able to assess due to phone visit  Speech:  Clear and Coherent and Normal Rate  Volume:  Normal  Mood:  Euthymic "ok and stable"  Affect:  Not able to assess due to phone visit  Thought Process:  Coherent and Linear  Orientation:  Full (Time, Place, and Person)  Thought Content:  Rumination  Suicidal Thoughts:  No  Homicidal Thoughts:  No  Memory:  Immediate;   Good Recent;   Good  Judgement:  Fair  Insight:  Good  Psychomotor Activity:  Not able to assess due to phone visit  Concentration:  Concentration: Good and Attention Span: Good  Recall:  Good  Fund of Knowledge:  Good  Language:  Good  Akathisia:  Not able to assess due to phone visit  Handed:  Right  AIMS (if indicated):   Not done due to phone visit  Assets:  Communication Skills Desire for Improvement Housing Social Support  ADL's:  Intact  Cognition:  WNL  Sleep:   poor, wakes up multiple times    Assessment and Plan:Deborah Arnold is a 51 year old  female with a past psychiatric history significant for anxiety and schizoaffective disorder (bipolar type) who presents to [facility] for follow-up and medication management.   1. Schizoaffective disorder, bipolar type (HCC)   - ARIPiprazole (ABILIFY) 10 MG tablet; Take 1 tablet (10 mg total) by mouth daily.  Dispense: 30 capsule; Refill: 2  2. Anxiety   - Gabapentin (NEURONTIN) 100 MG capsule; Take 1 capsule (100 mg total) by mouth every evening to help with anxiety and back pain.  Dispense: 30  capsule; Refill: 2   Follow Up Instructions:  Patient to follow up in 8 weeks.  Provider spent a total of 15 minutes with the patient/reviewing patient's chart  Collaboration of Care: Collaboration of Care: Medication Management AEB provider managing patient's psychiatric medications and Psychiatrist AEB patient being followed by mental health provider   Patient/Guardian was advised Release of Information must be obtained prior to any record release in order to collaborate their care with an outside provider. Patient/Guardian was advised if they have not already done so to contact the registration department to sign all necessary forms in order for [place] to release information regarding their care.   I provided 15 minutes of non-face-to-face time during this encounter.   Armando Reichert, MD

## 2021-12-20 ENCOUNTER — Ambulatory Visit (HOSPITAL_COMMUNITY): Admission: AD | Admit: 2021-12-20 | Payer: Medicaid Other | Source: Home / Self Care

## 2022-01-19 ENCOUNTER — Telehealth (HOSPITAL_COMMUNITY): Payer: Medicaid Other | Admitting: Psychiatry

## 2022-01-19 DIAGNOSIS — Z91199 Patient's noncompliance with other medical treatment and regimen due to unspecified reason: Secondary | ICD-10-CM

## 2022-01-19 NOTE — Progress Notes (Signed)
01/19/22 9:30 am- Logged into [employer] app for patient's video visit and waited for 10 minutes but patient didn't show up. Tried calling patient twice on the phone number provided in the chart but was unsuccessful.  Karsten Ro, MD PGY3 Psychiatry Resident  [facility]

## 2022-02-01 ENCOUNTER — Encounter (HOSPITAL_COMMUNITY): Payer: Self-pay | Admitting: Psychiatry

## 2022-03-09 ENCOUNTER — Telehealth (HOSPITAL_COMMUNITY): Payer: Medicaid Other | Admitting: Psychiatry

## 2022-03-09 NOTE — Progress Notes (Deleted)
03/09/22- Logged into [employer] app for patient's video visit and waited for 10 minutes but patient didn't show up. Tried calling patient on the phone number provided in the chart but was unsuccessful.   Armando Reichert, MD PGY3 Psychiatry Resident  [facility]

## 2022-03-16 ENCOUNTER — Telehealth (HOSPITAL_COMMUNITY): Payer: Medicaid Other | Admitting: Psychiatry

## 2022-03-16 ENCOUNTER — Ambulatory Visit (HOSPITAL_COMMUNITY): Payer: Medicaid Other | Admitting: Psychiatry

## 2022-03-16 ENCOUNTER — Encounter (HOSPITAL_COMMUNITY): Payer: Self-pay | Admitting: Psychiatry

## 2022-03-17 ENCOUNTER — Telehealth (HOSPITAL_COMMUNITY): Payer: Self-pay | Admitting: Psychiatry

## 2022-07-10 ENCOUNTER — Ambulatory Visit (HOSPITAL_COMMUNITY)
Admission: EM | Admit: 2022-07-10 | Discharge: 2022-07-10 | Disposition: A | Discharge status: Home / Self Care | Payer: Medicaid Other | Attending: Family Medicine | Admitting: Family Medicine

## 2022-07-10 DIAGNOSIS — F1410 Cocaine abuse, uncomplicated: Secondary | ICD-10-CM | POA: Insufficient documentation

## 2022-07-10 DIAGNOSIS — Z630 Problems in relationship with spouse or partner: Secondary | ICD-10-CM

## 2022-07-10 DIAGNOSIS — F250 Schizoaffective disorder, bipolar type: Secondary | ICD-10-CM | POA: Insufficient documentation

## 2022-07-10 DIAGNOSIS — F17210 Nicotine dependence, cigarettes, uncomplicated: Secondary | ICD-10-CM | POA: Insufficient documentation
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
## 2022-07-10 NOTE — Progress Notes (Signed)
   07/10/22 0745  [facility] Triage Screening (Walk-ins at [facility] only)  How Did You Hear About [place]? Family/Friend  What Is the Reason for Your Visit/Call Today? Pt presents to [facility] voluntarly accompanied by her fiance' seeking detox and substance use treatment for cocaine use. Pt reports being diagnosed with PTSD, Schizoaffective-bipolar type, MDD with psychotic features. Pt states she is prescribed medication for her symptoms by a psychiatrist at [facility]. Pt receives therapy services through [facility] as well, but missed her last appointment. Pt states she is here today because she relapsed on cocaine yesterday after being clean for 1 month. Pt reports being hospitalized in the past about 7 years ago for psychiatric reasons. Pt reports she uses CBD daily.Pt denies SI/HI and AVH.  How Long Has This Been Causing You Problems? <Week  Have You Recently Had Any Thoughts About Hurting Yourself? No  Are You Planning to Commit Suicide/Harm Yourself At This time? No  Have you Recently Had Thoughts About Hurting Someone Karolee Ohs? No  Are You Planning To Harm Someone At This Time? No  Are you currently experiencing any auditory, visual or other hallucinations? No  Have You Used Any Alcohol or Drugs in the Past 24 Hours? Yes  How long ago did you use Drugs or Alcohol? lastnight  What Did You Use and How Much? cocaine, unknown amount, CBD-unknown amount  Do you have any current medical co-morbidities that require immediate attention? No  Clinician description of patient physical appearance/behavior: agitated, cooperative  What Do You Feel Would Help You the Most Today? Alcohol or Drug Use Treatment  If access to [facility] was not available, would you have sought care in the Emergency Department? No  Determination of Need Routine (7 days)  Options For Referral Outpatient Therapy;Medication Management

## 2022-08-07 ENCOUNTER — Other Ambulatory Visit: Payer: Self-pay

## 2022-08-07 ENCOUNTER — Emergency Department (EMERGENCY_DEPARTMENT_HOSPITAL)
Admission: EM | Admit: 2022-08-07 | Discharge: 2022-08-08 | Disposition: A | Discharge status: Other Acute Inpatient Hospital | Payer: Medicaid Other | Source: Home / Self Care | Attending: Emergency Medicine | Admitting: Emergency Medicine

## 2022-08-07 DIAGNOSIS — X838XXA Intentional self-harm by other specified means, initial encounter: Secondary | ICD-10-CM | POA: Insufficient documentation

## 2022-08-07 DIAGNOSIS — F32A Depression, unspecified: Secondary | ICD-10-CM

## 2022-08-07 DIAGNOSIS — F331 Major depressive disorder, recurrent, moderate: Secondary | ICD-10-CM | POA: Diagnosis present

## 2022-08-07 DIAGNOSIS — T426X2A Poisoning by other antiepileptic and sedative-hypnotic drugs, intentional self-harm, initial encounter: Secondary | ICD-10-CM | POA: Insufficient documentation

## 2022-08-07 DIAGNOSIS — T50902A Poisoning by unspecified drugs, medicaments and biological substances, intentional self-harm, initial encounter: Secondary | ICD-10-CM

## 2022-08-07 DIAGNOSIS — T1491XA Suicide attempt, initial encounter: Secondary | ICD-10-CM | POA: Diagnosis present

## 2022-08-07 DIAGNOSIS — F332 Major depressive disorder, recurrent severe without psychotic features: Secondary | ICD-10-CM | POA: Insufficient documentation
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
## 2022-08-07 NOTE — ED Provider Notes (Signed)
[place] EMERGENCY DEPARTMENT AT [facility] Provider Note  CSN: [id number] Arrival date & time: 08/07/22 2235  Chief Complaint(s) Drug Overdose  HPI Deborah Arnold is a 52 y.o. female {Add pertinent medical, surgical, social history, OB history to HPI:1}   The history is provided by the patient.    Past Medical History Past Medical History:  Diagnosis Date   Abortion    x2   Family history of anesthesia complication    " my son has malignant hypothermia "   Preterm labor    Urinary tract infection    Patient Active Problem List   Diagnosis Date Noted   Anxiety state 05/28/2021   Schizoaffective disorder, bipolar type (HCC) 08/21/2018   Major depressive disorder, recurrent episode, moderate (HCC) 01/01/2016   Auditory hallucination    Home Medication(s) Prior to Admission medications   Medication Sig Start Date End Date Taking? Authorizing Provider  albuterol (VENTOLIN HFA) 108 (90 Base) MCG/ACT inhaler Inhale 1-2 puffs into the lungs every 6 (six) hours as needed for wheezing or shortness of breath. 09/14/21   Karsten Ro, MD  ARIPiprazole (ABILIFY) 10 MG tablet Take 1 tablet (10 mg total) by mouth at bedtime. 11/24/21   Karsten Ro, MD  ARIPiprazole ER (ABILIFY MAINTENA) 400 MG SRER injection Inject 2 mLs (400 mg total) into the muscle every 28 (twenty-eight) days. 08/27/18   Malvin Johns, MD  gabapentin (NEURONTIN) 100 MG capsule Take 1 capsule (100 mg total) by mouth every evening. 11/24/21   Karsten Ro, MD  hydrOXYzine (ATARAX) 10 MG tablet Take 1 tablet (10 mg total) by mouth 3 (three) times daily as needed. 08/10/21   Meta Hatchet, PA                                                                                                                                    Allergies Patient has no known allergies.  Review of Systems Review of Systems As noted in HPI  Physical Exam Vital Signs  I have reviewed the triage vital signs BP (!) 174/97    Pulse 85   Temp (!) 97.4 F (36.3 C)   Resp 10   Ht 5\' 3"  (1.6 m)   Wt 59 kg   SpO2 100%   BMI 23.03 kg/m   Physical Exam Vitals reviewed.  Constitutional:      General: She is not in acute distress.    Appearance: She is well-developed. She is not diaphoretic.  HENT:     Head: Normocephalic and atraumatic.     Right Ear: External ear normal.     Left Ear: External ear normal.     Nose: Nose normal.  Eyes:     General: No scleral icterus.       Right eye: No discharge.        Left eye: No discharge.     Conjunctiva/sclera: Conjunctivae normal.  Pupils: Pupils are equal, round, and reactive to light.  Neck:     Trachea: Phonation normal.  Cardiovascular:     Rate and Rhythm: Normal rate and regular rhythm.     Heart sounds: No murmur heard.    No friction rub. No gallop.  Pulmonary:     Effort: Pulmonary effort is normal. No respiratory distress.     Breath sounds: Normal breath sounds. No stridor. No rales.  Abdominal:     General: There is no distension.     Palpations: Abdomen is soft.     Tenderness: There is no abdominal tenderness.  Musculoskeletal:        General: No tenderness. Normal range of motion.     Cervical back: Normal range of motion and neck supple.  Skin:    General: Skin is warm and dry.     Findings: No erythema or rash.  Neurological:     Mental Status: She is alert and oriented to person, place, and time.  Psychiatric:        Behavior: Behavior normal.     ED Results and Treatments Labs (all labs ordered are listed, but only abnormal results are displayed) Labs Reviewed - No data to display                                                                                                                       EKG  EKG Interpretation  Date/Time:    Ventricular Rate:    PR Interval:    QRS Duration:   QT Interval:    QTC Calculation:   R Axis:     Text Interpretation:         Radiology No results found.  Medications Ordered  in ED Medications - No data to display Procedures Procedures  (including critical care time) Medical Decision Making / ED Course   Medical Decision Making   ***    Final Clinical Impression(s) / ED Diagnoses Final diagnoses:  None    This chart was dictated using voice recognition software.  Despite best efforts to proofread,  errors can occur which can change the documentation meaning.

## 2022-08-08 ENCOUNTER — Other Ambulatory Visit: Payer: Self-pay

## 2022-08-08 ENCOUNTER — Encounter (HOSPITAL_COMMUNITY): Payer: Self-pay | Admitting: Nurse Practitioner

## 2022-08-08 ENCOUNTER — Inpatient Hospital Stay (HOSPITAL_COMMUNITY)
Admission: AD | Admit: 2022-08-08 | Discharge: 2022-08-12 | DRG: 885 | Disposition: A | Discharge status: Home / Self Care | Payer: Medicaid Other | Source: Intra-hospital | Attending: Psychiatry | Admitting: Psychiatry

## 2022-08-08 DIAGNOSIS — F332 Major depressive disorder, recurrent severe without psychotic features: Secondary | ICD-10-CM | POA: Diagnosis present

## 2022-08-08 DIAGNOSIS — F250 Schizoaffective disorder, bipolar type: Secondary | ICD-10-CM

## 2022-08-08 DIAGNOSIS — F411 Generalized anxiety disorder: Secondary | ICD-10-CM | POA: Diagnosis present

## 2022-08-08 DIAGNOSIS — J45909 Unspecified asthma, uncomplicated: Secondary | ICD-10-CM | POA: Diagnosis present

## 2022-08-08 DIAGNOSIS — Z79899 Other long term (current) drug therapy: Secondary | ICD-10-CM

## 2022-08-08 DIAGNOSIS — F4001 Agoraphobia with panic disorder: Secondary | ICD-10-CM | POA: Diagnosis present

## 2022-08-08 DIAGNOSIS — F17210 Nicotine dependence, cigarettes, uncomplicated: Secondary | ICD-10-CM | POA: Diagnosis present

## 2022-08-08 DIAGNOSIS — T1491XA Suicide attempt, initial encounter: Secondary | ICD-10-CM | POA: Diagnosis present

## 2022-08-08 DIAGNOSIS — R9431 Abnormal electrocardiogram [ECG] [EKG]: Secondary | ICD-10-CM | POA: Diagnosis present

## 2022-08-08 DIAGNOSIS — Z9151 Personal history of suicidal behavior: Secondary | ICD-10-CM | POA: Diagnosis not present

## 2022-08-08 DIAGNOSIS — R030 Elevated blood-pressure reading, without diagnosis of hypertension: Secondary | ICD-10-CM | POA: Diagnosis present

## 2022-08-08 DIAGNOSIS — Z91199 Patient's noncompliance with other medical treatment and regimen due to unspecified reason: Secondary | ICD-10-CM

## 2022-08-08 LAB — CBC WITH DIFFERENTIAL/PLATELET
Abs Immature Granulocytes: 0.04 10*3/uL (ref 0.00–0.07)
Basophils Absolute: 0.1 10*3/uL (ref 0.0–0.1)
Basophils Relative: 1 %
Eosinophils Absolute: 0.2 10*3/uL (ref 0.0–0.5)
Eosinophils Relative: 3 %
HCT: 37.3 % (ref 36.0–46.0)
Hemoglobin: 11.2 g/dL — ABNORMAL LOW (ref 12.0–15.0)
Immature Granulocytes: 1 %
Lymphocytes Relative: 25 %
Lymphs Abs: 1.9 10*3/uL (ref 0.7–4.0)
MCH: 21.1 pg — ABNORMAL LOW (ref 26.0–34.0)
MCHC: 30 g/dL (ref 30.0–36.0)
MCV: 70.1 fL — ABNORMAL LOW (ref 80.0–100.0)
Monocytes Absolute: 0.5 10*3/uL (ref 0.1–1.0)
Monocytes Relative: 7 %
Neutro Abs: 5 10*3/uL (ref 1.7–7.7)
Neutrophils Relative %: 63 %
Platelets: 312 10*3/uL (ref 150–400)
RBC: 5.32 MIL/uL — ABNORMAL HIGH (ref 3.87–5.11)
RDW: 25 % — ABNORMAL HIGH (ref 11.5–15.5)
WBC: 7.9 10*3/uL (ref 4.0–10.5)
nRBC: 0 % (ref 0.0–0.2)

## 2022-08-08 LAB — COMPREHENSIVE METABOLIC PANEL
ALT: 19 U/L (ref 0–44)
AST: 27 U/L (ref 15–41)
Albumin: 3.7 g/dL (ref 3.5–5.0)
Alkaline Phosphatase: 45 U/L (ref 38–126)
Anion gap: 8 (ref 5–15)
BUN: 12 mg/dL (ref 6–20)
CO2: 22 mmol/L (ref 22–32)
Calcium: 9.1 mg/dL (ref 8.9–10.3)
Chloride: 105 mmol/L (ref 98–111)
Creatinine, Ser: 0.67 mg/dL (ref 0.44–1.00)
GFR, Estimated: >60 mL/min (ref >60)
Glucose, Bld: 82 mg/dL (ref 70–99)
Potassium: 3.3 mmol/L — ABNORMAL LOW (ref 3.5–5.1)
Sodium: 135 mmol/L (ref 135–145)
Total Bilirubin: 0.6 mg/dL (ref 0.3–1.2)
Total Protein: 7.2 g/dL (ref 6.5–8.1)

## 2022-08-08 LAB — RAPID URINE DRUG SCREEN, HOSP PERFORMED
Amphetamines: POSITIVE — AB
Barbiturates: NOT DETECTED
Benzodiazepines: NOT DETECTED
Cocaine: POSITIVE — AB
Opiates: NOT DETECTED
Tetrahydrocannabinol: POSITIVE — AB

## 2022-08-08 LAB — SALICYLATE LEVEL: Salicylate Lvl: <7 mg/dL — ABNORMAL LOW (ref 7.0–30.0)

## 2022-08-08 LAB — ACETAMINOPHEN LEVEL: Acetaminophen (Tylenol), Serum: <10 ug/mL — ABNORMAL LOW (ref 10–30)

## 2022-08-08 LAB — I-STAT BETA HCG BLOOD, ED (MC, WL, AP ONLY): I-stat hCG, quantitative: <5 m[IU]/mL (ref <5)

## 2022-08-08 LAB — ETHANOL: Alcohol, Ethyl (B): <10 mg/dL (ref <10)

## 2022-08-08 MED ORDER — ALBUTEROL SULFATE HFA 108 (90 BASE) MCG/ACT IN AERS: 1.0000 | INHALATION_SPRAY | Freq: Four times a day (QID) | RESPIRATORY_TRACT | Status: DC | PRN

## 2022-08-08 MED ORDER — POTASSIUM CHLORIDE CRYS ER 20 MEQ PO TBCR
40.0000 meq | EXTENDED_RELEASE_TABLET | Freq: Once | ORAL | Status: AC
  Administered 2022-08-08: 40 meq via ORAL
  Filled 2022-08-08: qty 2

## 2022-08-08 MED ORDER — MAGNESIUM HYDROXIDE 400 MG/5ML PO SUSP
30.0000 mL | Freq: Every day | ORAL | Status: DC | PRN
  Administered 2022-08-08 – 2022-08-11 (×3): 30 mL via ORAL
  Filled 2022-08-08 (×3): qty 30

## 2022-08-08 MED ORDER — ALUM & MAG HYDROXIDE-SIMETH 200-200-20 MG/5ML PO SUSP: 30.0000 mL | ORAL | Status: DC | PRN

## 2022-08-08 MED ORDER — ARIPIPRAZOLE 10 MG PO TABS: 10.0000 mg | ORAL_TABLET | Freq: Every day | ORAL | Status: DC

## 2022-08-08 MED ORDER — ACETAMINOPHEN 325 MG PO TABS
650.0000 mg | ORAL_TABLET | Freq: Four times a day (QID) | ORAL | Status: DC | PRN
  Administered 2022-08-09 – 2022-08-10 (×2): 650 mg via ORAL
  Filled 2022-08-08 (×2): qty 2

## 2022-08-08 MED ORDER — GABAPENTIN 100 MG PO CAPS
100.0000 mg | ORAL_CAPSULE | Freq: Every evening | ORAL | Status: DC
  Administered 2022-08-08 – 2022-08-11 (×4): 100 mg via ORAL
  Filled 2022-08-08 (×6): qty 1

## 2022-08-08 MED ORDER — NICOTINE 21 MG/24HR TD PT24
21.0000 mg | MEDICATED_PATCH | Freq: Every day | TRANSDERMAL | Status: DC
  Filled 2022-08-08 (×6): qty 1

## 2022-08-08 MED ORDER — ARIPIPRAZOLE 10 MG PO TABS
10.0000 mg | ORAL_TABLET | Freq: Every day | ORAL | Status: DC
  Administered 2022-08-08: 10 mg via ORAL
  Filled 2022-08-08 (×4): qty 1

## 2022-08-08 MED ORDER — ENSURE ENLIVE PO LIQD
237.0000 mL | Freq: Two times a day (BID) | ORAL | Status: DC
  Administered 2022-08-09 – 2022-08-12 (×5): 237 mL via ORAL
  Filled 2022-08-08 (×9): qty 237

## 2022-08-08 NOTE — ED Notes (Signed)
Pt unwilling to sign voluntary admission to [facility]. Pysch provider made aware, who verbalizes she will place pt under IVC.

## 2022-08-08 NOTE — Tx Team (Signed)
Initial Treatment Plan 08/08/2022 6:54 PM Deborah Arnold [id number]    PATIENT STRESSORS: Financial difficulties   Health problems   Marital or family conflict   Occupational concerns   Substance abuse     PATIENT STRENGTHS: Capable of independent living  Communication skills  General fund of knowledge  Motivation for treatment/growth  Physical Health  Supportive family/friends  Work skills    PATIENT IDENTIFIED PROBLEMS: "Anxiety"  "Depression"  "Financial"  "Substance abuse"  "Family and relationship problems"             DISCHARGE CRITERIA:  Ability to meet basic life and health needs Adequate post-discharge living arrangements Improved stabilization in mood, thinking, and/or behavior Medical problems require only outpatient monitoring Motivation to continue treatment in a less acute level of care Need for constant or close observation no longer present Reduction of life-threatening or endangering symptoms to within safe limits Safe-care adequate arrangements made Verbal commitment to aftercare and medication compliance  PRELIMINARY DISCHARGE PLAN: Attend aftercare/continuing care group Attend PHP/IOP Attend 12-step recovery group Outpatient therapy Placement in alternative living arrangements Return to previous work or school arrangements  PATIENT/FAMILY INVOLVEMENT: This treatment plan has been presented to and reviewed with the patient, Deborah Arnold, and/or family member..  The patient and family have been given the opportunity to ask questions and make suggestions.  Quintella Reichert [place], RN 08/08/2022, 6:54 PM

## 2022-08-08 NOTE — BH Assessment (Signed)
Attempted tele-assessment. Pt appeared somnolent. She gave her name and date of birth, then would not open her eyes or respond to questions.   Pamalee Leyden, [facility], [facility] Triage Specialist [phone]

## 2022-08-08 NOTE — Progress Notes (Signed)
Pt was accepted to [facility] TODAY 08/08/2022, pending IVC paperwork faxed to [phone]. Bed assignment: 304-1  Pt meets inpatient criteria per Dahlia Byes, NP  Attending Physician will be Phineas Inches, MD  Report can be called to: - Adult unit: [phone]  Pt can arrive after pending items are received; [facility] to coordinate arrival time  Care Team Notified: [facility] Rona Ravens, RN, Dahlia Byes, NP, and Elenore Paddy, RN  [place], [place]  08/08/2022 12:33 PM

## 2022-08-08 NOTE — ED Notes (Signed)
Counselor at bedside for [facility] consult
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
## 2022-08-08 NOTE — Group Note (Signed)
Date:  08/08/2022 Time:  9:21 PM  Group Topic/Focus:  Recovery Goals:   The focus of this group is to identify appropriate goals for recovery and establish a plan to achieve them.    Participation Level:  Did Not Attend  Participation Quality:   did not attend  Affect:   n/a  Cognitive:   n/a  Insight: None  Engagement in Group:   n/a  Modes of Intervention:   n/a  Additional Comments:  patient did not attend group  Alannis Hsia E Larose Batres 08/08/2022, 9:21 PM

## 2022-08-08 NOTE — ED Notes (Signed)
Pt to [facility] via safe transport, escorted to bay via wheel chair. Report called to [facility].

## 2022-08-08 NOTE — ED Notes (Signed)
Pt in room talking to herself, stating "I don't love you, you son of a bitch".

## 2022-08-08 NOTE — Plan of Care (Signed)
Nurse discussed coping skills with patient.  
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
## 2022-08-08 NOTE — ED Notes (Signed)
Psych provider bedside for TTC

## 2022-08-09 DIAGNOSIS — F332 Major depressive disorder, recurrent severe without psychotic features: Secondary | ICD-10-CM | POA: Diagnosis not present

## 2022-08-09 MED ORDER — POTASSIUM CHLORIDE CRYS ER 20 MEQ PO TBCR
20.0000 meq | EXTENDED_RELEASE_TABLET | Freq: Once | ORAL | Status: AC
  Administered 2022-08-09: 20 meq via ORAL
  Filled 2022-08-09 (×2): qty 1

## 2022-08-09 MED ORDER — TRAZODONE HCL 50 MG PO TABS
50.0000 mg | ORAL_TABLET | Freq: Every day | ORAL | Status: DC
  Administered 2022-08-09 – 2022-08-11 (×3): 50 mg via ORAL
  Filled 2022-08-09 (×5): qty 1

## 2022-08-09 MED ORDER — HYDROXYZINE HCL 25 MG PO TABS
25.0000 mg | ORAL_TABLET | Freq: Three times a day (TID) | ORAL | Status: DC | PRN
  Administered 2022-08-10 – 2022-08-11 (×3): 25 mg via ORAL
  Filled 2022-08-09 (×3): qty 1

## 2022-08-09 MED ORDER — NICOTINE POLACRILEX 2 MG MT GUM
2.0000 mg | CHEWING_GUM | OROMUCOSAL | Status: DC | PRN
  Administered 2022-08-09: 2 mg via ORAL

## 2022-08-09 MED ORDER — ARIPIPRAZOLE 5 MG PO TABS
5.0000 mg | ORAL_TABLET | Freq: Every day | ORAL | Status: DC
  Administered 2022-08-09: 5 mg via ORAL
  Filled 2022-08-09 (×4): qty 1

## 2022-08-09 NOTE — Group Note (Signed)
Recreation Therapy Group Note   Group Topic:Animal Assisted Therapy   Group Date: 08/09/2022 Start Time: 0945 End Time: 1030 Facilitators: Deborah Arnold, LRT,CTRS Location: [address] Hall Dayroom   Animal-Assisted Activity (AAA) Program Checklist/Progress Notes Patient Eligibility Criteria Checklist & Daily Group note for Rec Tx Intervention  AAA/T Program Assumption of Risk Form signed by Patient/ or Parent Legal Guardian Yes  Patient is free of allergies or severe asthma Yes  Patient reports no fear of animals Yes  Patient reports no history of cruelty to animals Yes  Patient understands his/her participation is voluntary Yes  Patient washes hands before animal contact Yes  Patient washes hands after animal contact Yes   Affect/Mood: Appropriate   Participation Level: Engaged   Participation Quality: Independent   Behavior: Disruptive   Speech/Thought Process: Focused    Clinical Observations/Individualized Feedback: Pt came in late to group.  Pt started eating snack during group and was asked to not eat snack because of the distraction it would be to the therapy dog.  Pt did have good interaction with the dog but still needed constant redirection to not eat during group.      Plan: Continue to engage patient in RT group sessions 2-3x/week.   Deborah Arnold, LRT,CTRS 08/09/2022 12:31 PM

## 2022-08-09 NOTE — Progress Notes (Signed)
D:  Patient's self inventory sheet, patient has fair to poor sleep.  No sleep medication.  Good appetite, normal energy level, good concentration.  Denied depression, hopeless and anxiety.  Denied withdrawals.  Denied SI.  Denied physical problems.  Goal is discharge with no anxiety.  Plans to attend all meetings.  No discharge plan. A:  Medications administered per MD orders.  Emotional support and encouragement given patient. R:  Denied SI and HI, contracts for safety.  Denied A/V hallucinations.  Safety maintained with 15 minute checks.

## 2022-08-09 NOTE — Plan of Care (Signed)
  Problem: Education: Goal: Utilization of techniques to improve thought processes will improve Outcome: Progressing   Problem: Education: Goal: Mental status will improve Outcome: Progressing   Problem: Education: Goal: Ability to make informed decisions regarding treatment will improve Outcome: Progressing

## 2022-08-09 NOTE — Progress Notes (Signed)
   08/09/22 2200  Psych Admission Type (Psych Patients Only)  Admission Status Voluntary  Psychosocial Assessment  Patient Complaints Anxiety  Eye Contact Fair  Facial Expression Anxious  Affect Anxious;Depressed  Speech Logical/coherent  Interaction Assertive  Motor Activity Slow  Appearance/Hygiene Disheveled  Behavior Characteristics Appropriate to situation;Cooperative  Mood Depressed;Anxious  Thought Process  Coherency WDL  Content Blaming others  Delusions None reported or observed  Perception WDL  Hallucination None reported or observed  Judgment Poor  Confusion None  Danger to Self  Current suicidal ideation? Denies  Agreement Not to Harm Self Yes  Description of Agreement Verbal contract  Danger to Others  Danger to Others None reported or observed

## 2022-08-09 NOTE — Progress Notes (Signed)
Pt did not attend wrap-up group   

## 2022-08-09 NOTE — H&P (Cosign Needed Addendum)
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 Secondary Diagnosis: Principal Problem:   Major depressive disorder, recurrent severe without psychotic features (HCC)  I certify that inpatient services furnished can reasonably be expected to improve the patient's condition.    Cecilie Lowers, FNP 6/11/20243:24 PM

## 2022-08-09 NOTE — Progress Notes (Signed)
Patient stated she cannot take tylenol, makes her sleepy. Patient denied SI and HI, contracts for safety.  Denied A/V hallucinations.  Stated she did not sleep well last night, rolled around in bed most of the night. Patient has slight tremors, ate big breakfast, feels much better this morning with a better attitude today.

## 2022-08-09 NOTE — Plan of Care (Signed)
Nurse discussed anxiety, depression and coping skills with patient.  

## 2022-08-09 NOTE — Progress Notes (Signed)
Pt was tearful upon nurse entering room. Pt stated she did not know why she was crying, she just felt sad. After an hour, pt was seen interacting positively with staff and peers in the dayroom. She took her bedtime medications ans soon after went to sleep.   Pt was placed with no roommate earlier because of poor boundaries.  She lays on roommates bed and touches roommate belongings without permission.

## 2022-08-09 NOTE — Group Note (Signed)
LCSW Group Therapy Note Did Not Attend  Group Date: 08/09/2022 Start Time: 1100 End Time: [address] Riot Barrick, LCSW 08/09/2022  3:15 PM
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
## 2022-08-10 ENCOUNTER — Encounter (HOSPITAL_COMMUNITY): Payer: Self-pay

## 2022-08-10 DIAGNOSIS — F332 Major depressive disorder, recurrent severe without psychotic features: Secondary | ICD-10-CM | POA: Diagnosis not present

## 2022-08-10 LAB — BASIC METABOLIC PANEL
Anion gap: 9 (ref 5–15)
BUN: 11 mg/dL (ref 6–20)
CO2: 21 mmol/L — ABNORMAL LOW (ref 22–32)
Calcium: 9 mg/dL (ref 8.9–10.3)
Chloride: 108 mmol/L (ref 98–111)
Creatinine, Ser: 0.58 mg/dL (ref 0.44–1.00)
GFR, Estimated: >60 mL/min (ref >60)
Glucose, Bld: 116 mg/dL — ABNORMAL HIGH (ref 70–99)
Potassium: 3.9 mmol/L (ref 3.5–5.1)
Sodium: 138 mmol/L (ref 135–145)

## 2022-08-10 LAB — HEMOGLOBIN A1C
Hgb A1c MFr Bld: 5.2 % (ref 4.8–5.6)
Mean Plasma Glucose: 102.54 mg/dL

## 2022-08-10 LAB — LIPID PANEL
Cholesterol: 177 mg/dL (ref 0–200)
HDL: 67 mg/dL (ref >40)
LDL Cholesterol: 97 mg/dL (ref 0–99)
Total CHOL/HDL Ratio: 2.6 RATIO
Triglycerides: 64 mg/dL (ref <150)
VLDL: 13 mg/dL (ref 0–40)

## 2022-08-10 LAB — TSH: TSH: 1.082 u[IU]/mL (ref 0.350–4.500)

## 2022-08-10 MED ORDER — FLUOXETINE HCL 20 MG PO CAPS
20.0000 mg | ORAL_CAPSULE | Freq: Every day | ORAL | Status: DC
  Administered 2022-08-10 – 2022-08-12 (×3): 20 mg via ORAL
  Filled 2022-08-10 (×6): qty 1

## 2022-08-10 MED ORDER — ONDANSETRON 4 MG PO TBDP
4.0000 mg | ORAL_TABLET | Freq: Three times a day (TID) | ORAL | Status: DC | PRN
  Administered 2022-08-10: 4 mg via ORAL
  Filled 2022-08-10: qty 1

## 2022-08-10 MED ORDER — ARIPIPRAZOLE 10 MG PO TABS
10.0000 mg | ORAL_TABLET | Freq: Every day | ORAL | Status: DC
  Administered 2022-08-10 – 2022-08-11 (×2): 10 mg via ORAL
  Filled 2022-08-10 (×3): qty 1

## 2022-08-10 NOTE — Progress Notes (Signed)
Patient reports "I'm great" this morning. Had 2 episodes of N/V today and given PRN ondansetron. Temperature is normal. Patient offered no complaints and denies SI, HI, AVH. Patient kept to room throughout the day. Patient remains safe, Q 15 min checks ongoing.   08/10/22 1500  Psych Admission Type (Psych Patients Only)  Admission Status Voluntary  Psychosocial Assessment  Patient Complaints None  Eye Contact Fair  Facial Expression Anxious;Pained  Affect Anxious;Depressed  Speech Logical/coherent  Interaction Assertive  Motor Activity Slow  Appearance/Hygiene Disheveled  Behavior Characteristics Cooperative;Appropriate to situation  Mood Anxious;Depressed  Thought Process  Coherency WDL  Content Blaming others  Delusions None reported or observed  Perception WDL  Hallucination None reported or observed  Judgment Poor  Confusion None  Danger to Self  Current suicidal ideation? Denies  Agreement Not to Harm Self Yes  Description of Agreement verbal contract  Danger to Others  Danger to Others None reported or observed

## 2022-08-10 NOTE — BH IP Treatment Plan (Signed)
Interdisciplinary Treatment and Diagnostic Plan Update  08/10/2022 Time of Session: 10:45 AM  Deborah Arnold MRN: [id number]  Principal Diagnosis: Major depressive disorder, recurrent severe without psychotic features (HCC)  Secondary Diagnoses: Principal Problem:   Major depressive disorder, recurrent severe without psychotic features (HCC)   Current Medications:  Current Facility-Administered Medications  Medication Dose Route Frequency Provider Last Rate Last Admin   acetaminophen (TYLENOL) tablet 650 mg  650 mg Oral Q6H PRN Dahlia Byes C, NP   650 mg at 08/09/22 1146   albuterol (VENTOLIN HFA) 108 (90 Base) MCG/ACT inhaler 1-2 puff  1-2 puff Inhalation Q6H PRN Onuoha, Josephine C, NP       alum & mag hydroxide-simeth (MAALOX/MYLANTA) 200-200-20 MG/5ML suspension 30 mL  30 mL Oral Q4H PRN Onuoha, Josephine C, NP       ARIPiprazole (ABILIFY) tablet 10 mg  10 mg Oral QHS Ntuen, Tina C, FNP       feeding supplement (ENSURE ENLIVE / ENSURE PLUS) liquid 237 mL  237 mL Oral BID BM Massengill, Nathan, MD   237 mL at 08/10/22 1040   gabapentin (NEURONTIN) capsule 100 mg  100 mg Oral QPM Sindy Guadeloupe, NP   100 mg at 08/09/22 1710   hydrOXYzine (ATARAX) tablet 25 mg  25 mg Oral TID PRN Cecilie Lowers, FNP   25 mg at 08/10/22 0716   magnesium hydroxide (MILK OF MAGNESIA) suspension 30 mL  30 mL Oral Daily PRN Dahlia Byes C, NP   30 mL at 08/09/22 1142   nicotine (NICODERM CQ - dosed in mg/24 hours) patch 21 mg  21 mg Transdermal Daily Massengill, Nathan, MD       nicotine polacrilex (NICORETTE) gum 2 mg  2 mg Oral PRN Abbott Pao, Nadir, MD   2 mg at 08/09/22 0757   ondansetron (ZOFRAN-ODT) disintegrating tablet 4 mg  4 mg Oral Q8H PRN Abbott Pao, Nadir, MD       traZODone (DESYREL) tablet 50 mg  50 mg Oral QHS Ntuen, Tina C, FNP   50 mg at 08/09/22 2142   PTA Medications: Medications Prior to Admission  Medication Sig Dispense Refill Last Dose   ARIPiprazole (ABILIFY) 5 MG tablet Take 5 mg  by mouth daily.      FLUoxetine (PROZAC) 20 MG capsule Take 20 mg by mouth daily.      gabapentin (NEURONTIN) 100 MG capsule Take 1 capsule (100 mg total) by mouth every evening. 30 capsule 2     Patient Stressors: Financial difficulties   Health problems   Marital or family conflict   Occupational concerns   Substance abuse    Patient Strengths: Capable of independent living  Forensic psychologist fund of knowledge  Motivation for treatment/growth  Physical Health  Supportive family/friends  Work skills   Treatment Modalities: Medication Management, Group therapy, Case management,  1 to 1 session with clinician, Psychoeducation, Recreational therapy.   Physician Treatment Plan for Primary Diagnosis: Major depressive disorder, recurrent severe without psychotic features (HCC) Long Term Goal(s): Improvement in symptoms so as ready for discharge   Short Term Goals: Ability to identify changes in lifestyle to reduce recurrence of condition will improve Ability to verbalize feelings will improve Ability to disclose and discuss suicidal ideas Ability to demonstrate self-control will improve Ability to identify and develop effective coping behaviors will improve Ability to maintain clinical measurements within normal limits will improve Compliance with prescribed medications will improve Ability to identify triggers associated with substance abuse/mental health issues will  improve  Medication Management: Evaluate patient's response, side effects, and tolerance of medication regimen.  Therapeutic Interventions: 1 to 1 sessions, Unit Group sessions and Medication administration.  Evaluation of Outcomes: Not Progressing  Physician Treatment Plan for Secondary Diagnosis: Principal Problem:   Major depressive disorder, recurrent severe without psychotic features (HCC)  Long Term Goal(s): Improvement in symptoms so as ready for discharge   Short Term Goals: Ability to  identify changes in lifestyle to reduce recurrence of condition will improve Ability to verbalize feelings will improve Ability to disclose and discuss suicidal ideas Ability to demonstrate self-control will improve Ability to identify and develop effective coping behaviors will improve Ability to maintain clinical measurements within normal limits will improve Compliance with prescribed medications will improve Ability to identify triggers associated with substance abuse/mental health issues will improve     Medication Management: Evaluate patient's response, side effects, and tolerance of medication regimen.  Therapeutic Interventions: 1 to 1 sessions, Unit Group sessions and Medication administration.  Evaluation of Outcomes: Not Progressing   RN Treatment Plan for Primary Diagnosis: Major depressive disorder, recurrent severe without psychotic features (HCC) Long Term Goal(s): Knowledge of disease and therapeutic regimen to maintain health will improve  Short Term Goals: Ability to remain free from injury will improve, Ability to verbalize frustration and anger appropriately will improve, Ability to demonstrate self-control, Ability to participate in decision making will improve, Ability to verbalize feelings will improve, Ability to disclose and discuss suicidal ideas, Ability to identify and develop effective coping behaviors will improve, and Compliance with prescribed medications will improve  Medication Management: RN will administer medications as ordered by provider, will assess and evaluate patient's response and provide education to patient for prescribed medication. RN will report any adverse and/or side effects to prescribing provider.  Therapeutic Interventions: 1 on 1 counseling sessions, Psychoeducation, Medication administration, Evaluate responses to treatment, Monitor vital signs and CBGs as ordered, Perform/monitor CIWA, COWS, AIMS and Fall Risk screenings as ordered,  Perform wound care treatments as ordered.  Evaluation of Outcomes: Not Progressing   LCSW Treatment Plan for Primary Diagnosis: Major depressive disorder, recurrent severe without psychotic features (HCC) Long Term Goal(s): Safe transition to appropriate next level of care at discharge, Engage patient in therapeutic group addressing interpersonal concerns.  Short Term Goals: Engage patient in aftercare planning with referrals and resources, Increase social support, Increase ability to appropriately verbalize feelings, Increase emotional regulation, Facilitate acceptance of mental health diagnosis and concerns, Facilitate patient progression through stages of change regarding substance use diagnoses and concerns, Identify triggers associated with mental health/substance abuse issues, and Increase skills for wellness and recovery  Therapeutic Interventions: Assess for all discharge needs, 1 to 1 time with Social worker, Explore available resources and support systems, Assess for adequacy in community support network, Educate family and significant other(s) on suicide prevention, Complete Psychosocial Assessment, Interpersonal group therapy.  Evaluation of Outcomes: Not Progressing   Progress in Treatment: Attending groups: Yes. Participating in groups: Yes. Taking medication as prescribed: Yes. Toleration medication: Yes. Family/Significant other contact made: No, will contact:  N- Mr. Phineas Real (BF) [phone] Patient understands diagnosis: Yes. Discussing patient identified problems/goals with staff: Yes. Medical problems stabilized or resolved: Yes. Denies suicidal/homicidal ideation: Yes. Issues/concerns per patient self-inventory: No.   New problem(s) identified: No, Describe:  None reported   New Short Term/Long Term Goal(s):detox, medication management for mood stabilization; elimination of SI thoughts; development of comprehensive mental wellness/sobriety plan  Patient Goals:  "  get medication control and  stop hearing voices "   Discharge Plan or Barriers: Patient recently admitted. CSW will continue to follow and assess for appropriate referrals and possible discharge planning.    Reason for Continuation of Hospitalization: Anxiety Depression Hallucinations Medication stabilization Suicidal ideation Withdrawal symptoms  Estimated Length of Stay:3-5 days   Last 3 [place] Suicide Severity Risk Score: Flowsheet Row [facility] (Current) [facility] 08[facility]07/2022 in [facility] ED from 07/10/2022 in [facility]  C-SSRS RISK CATEGORY No Risk No Risk No Risk       Last PHQ 2/9 Scores:    08/10/2021    3:44 PM 05/28/2021    8:39 AM 06/03/2020    9:18 AM  Depression screen PHQ 2/9  Decreased Interest 0 0 0  Down, Depressed, Hopeless 1 2 2   PHQ - 2 Score 1 2 2   Altered sleeping  2 2  Tired, decreased energy  0 2  Change in appetite  2 2  Feeling bad or failure about yourself   0 1  Trouble concentrating  0 2  Moving slowly or fidgety/restless  1 1  Suicidal thoughts  1 0  PHQ-9 Score  8 12  Difficult doing work/chores  Somewhat difficult Somewhat difficult    Scribe for Treatment Team: Isabella Bowens, LCSWA 08/10/2022 1:50 PM

## 2022-08-10 NOTE — Progress Notes (Signed)
   08/10/22 2300  Psych Admission Type (Psych Patients Only)  Admission Status Voluntary  Psychosocial Assessment  Patient Complaints Anxiety  Eye Contact Fair  Facial Expression Anxious;Pained  Affect Anxious;Depressed  Speech Logical/coherent  Interaction Assertive  Motor Activity Slow  Appearance/Hygiene Disheveled  Behavior Characteristics Cooperative;Appropriate to situation  Mood Depressed;Anxious  Thought Process  Coherency WDL  Content Blaming others  Delusions None reported or observed  Perception WDL  Hallucination None reported or observed  Judgment Poor  Confusion None  Danger to Self  Current suicidal ideation? Denies  Agreement Not to Harm Self Yes  Description of Agreement Verbal contract  Danger to Others  Danger to Others None reported or observed

## 2022-08-10 NOTE — Plan of Care (Signed)
  Problem: Education: Goal: Utilization of techniques to improve thought processes will improve Outcome: Progressing   Problem: Education: Goal: Mental status will improve Outcome: Progressing   Problem: Education: Goal: Knowledge of disease or condition will improve Outcome: Progressing   Problem: Education: Goal: Ability to make informed decisions regarding treatment will improve Outcome: Progressing

## 2022-08-10 NOTE — Group Note (Signed)
Date:  08/10/2022 Time:  9:52 PM  Group Topic/Focus:  NA/Wrap Up and Wind Down Group    Participation Level:  Did Not Attend  Participation Quality:   n/a  Affect:   n/a  Cognitive:   n/a  Insight: None  Engagement in Group:   n/a  Modes of Intervention:   n/a  Additional Comments:   Pt did not attend.  Deborah Arnold 08/10/2022, 9:52 PM

## 2022-08-10 NOTE — Group Note (Signed)
Recreation Therapy Group Note   Group Topic:Leisure Education  Group Date: 08/10/2022 Start Time: 0930 End Time: 1000 Facilitators: Adara Kittle-McCall, LRT,CTRS Location: [address] Dayroom   Goal Area(s) Addresses:  Patient will successfully identify positive leisure and recreation activities.  Patient will acknowledge benefits of participation in healthy leisure activities post discharge.  Patient will actively work with peers toward a shared goal.    Group Description: Pictionary. In groups of 5-7, patients took turns trying to guess the picture being drawn on the board by their teammate.  If the team guessed the correct answer, they won a point.  If the team guessed wrong, the other team got a chance to steal the point. After several rounds of game play, the team with the most points were declared winners. Post-activity discussion reviewed benefits of positive recreation outlets: reducing stress, improving coping mechanisms, increasing self-esteem, and building larger support systems.    Clinical Observations/Individualized Feedback: Group unable to be facilitated due orientation group running into recreation therapy group time.   Plan: Continue to engage patient in RT group sessions 2-3x/week.   Albert Devaul-McCall, LRT,CTRS 08/10/2022 2:03 PM

## 2022-08-10 NOTE — [facility] Suicide Risk Assessment (Signed)
[facility] INPATIENT:  Family/Significant Other Suicide Prevention Education  Suicide Prevention Education:  Education Completed; 08-10-2022,  [name] (BF) [phone] has been identified by the patient as the family member/significant other with whom the patient will be residing, and identified as the person(s) who will aid the patient in the event of a mental health crisis (suicidal ideations/suicide attempt).  With written consent from the patient, the family member/significant other has been provided the following suicide prevention education, prior to the and/or following the discharge of the patient.  The suicide prevention education provided includes the following: Suicide risk factors Suicide prevention and interventions National Suicide Hotline telephone number [facility] assessment telephone number [facility] Emergency Assistance 911 [facility] and/or Residential Mobile Crisis Unit telephone number  Request made of family/significant other to: Remove weapons (e.g., guns, rifles, knives), all items previously/currently identified as safety concern.   Remove drugs/medications (over-the-counter, prescriptions, illicit drugs), all items previously/currently identified as a safety concern.  The family member/significant other verbalizes understanding of the suicide prevention education information provided.  The family member/significant other agrees to remove the items of safety concern listed above.  Deborah Arnold 08/10/2022, 3:39 PM
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
## 2022-08-10 NOTE — Progress Notes (Signed)
   08/10/22 0559  15 Minute Checks  Location Bedroom  Visual Appearance Calm  Behavior Sleeping  Sleep (Behavioral Health Patients Only)  Calculate sleep? (Click Yes once per 24 hr at 0600 safety check) Yes  Documented sleep last 24 hours 8    
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
## 2022-08-10 NOTE — Group Note (Signed)
Date:  08/11/2022 Time:  10:28 AM  Group Topic/Focus:  Goals Group:   The focus of this group is to help patients establish daily goals to achieve during treatment and discuss how the patient can incorporate goal setting into their daily lives to aide in recovery.    Participation Level:  Did Not Attend  Additional Comments:  Patient was encouraged to attend group multiple times.   Lauranne Beyersdorf T Riah Kehoe 08/11/2022, 10:28 AM

## 2022-08-11 DIAGNOSIS — F332 Major depressive disorder, recurrent severe without psychotic features: Secondary | ICD-10-CM | POA: Diagnosis not present

## 2022-08-11 MED ORDER — CLONIDINE HCL 0.1 MG PO TABS
0.1000 mg | ORAL_TABLET | Freq: Four times a day (QID) | ORAL | Status: DC | PRN
  Administered 2022-08-12: 0.1 mg via ORAL
  Filled 2022-08-11: qty 1

## 2022-08-11 MED ORDER — CLONIDINE HCL 0.1 MG PO TABS
0.1000 mg | ORAL_TABLET | Freq: Once | ORAL | Status: AC
  Administered 2022-08-11: 0.1 mg via ORAL
  Filled 2022-08-11 (×2): qty 1

## 2022-08-11 NOTE — [facility] Group Notes (Signed)
Patient did not attend the Warp-up group.

## 2022-08-11 NOTE — Progress Notes (Signed)
   08/11/22 0615  15 Minute Checks  Location Hallway  Visual Appearance Calm  Behavior Composed  Sleep (Behavioral Health Patients Only)  Calculate sleep? (Click Yes once per 24 hr at 0600 safety check) Yes  Documented sleep last 24 hours 9.5
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
## 2022-08-11 NOTE — Progress Notes (Signed)
   08/11/22 1042  Psych Admission Type (Psych Patients Only)  Admission Status Voluntary  Psychosocial Assessment  Patient Complaints Anxiety  Eye Contact Fair  Facial Expression Anxious  Affect Anxious  Speech Logical/coherent  Interaction Assertive  Motor Activity Slow  Appearance/Hygiene Disheveled  Behavior Characteristics Cooperative  Mood Anxious  Thought Process  Coherency WDL  Content WDL  Delusions None reported or observed  Perception WDL  Hallucination None reported or observed  Judgment Poor  Confusion None  Danger to Self  Current suicidal ideation? Denies  Agreement Not to Harm Self Yes  Description of Agreement verbal  Danger to Others  Danger to Others None reported or observed   Pt states she is anxious to get back to work.

## 2022-08-11 NOTE — Plan of Care (Signed)
  Problem: Education: Goal: Emotional status will improve Outcome: Not Progressing Goal: Mental status will improve Outcome: Not Progressing   Problem: Education: Goal: Knowledge of disease or condition will improve Outcome: Not Progressing

## 2022-08-12 DIAGNOSIS — F332 Major depressive disorder, recurrent severe without psychotic features: Secondary | ICD-10-CM | POA: Diagnosis not present

## 2022-08-12 MED ORDER — NICOTINE 21 MG/24HR TD PT24: 21.0000 mg | MEDICATED_PATCH | Freq: Every day | TRANSDERMAL | 0 refills | Status: AC

## 2022-08-12 MED ORDER — TRAZODONE HCL 50 MG PO TABS: 50.0000 mg | ORAL_TABLET | Freq: Every day | ORAL | 0 refills | Status: AC

## 2022-08-12 MED ORDER — GABAPENTIN 100 MG PO CAPS
100.0000 mg | ORAL_CAPSULE | Freq: Every evening | ORAL | 0 refills | Status: AC
Start: 2022-08-12 — End: ?

## 2022-08-12 MED ORDER — HYDROXYZINE HCL 25 MG PO TABS: 25.0000 mg | ORAL_TABLET | Freq: Three times a day (TID) | ORAL | 0 refills | Status: AC | PRN

## 2022-08-12 MED ORDER — FLUOXETINE HCL 20 MG PO CAPS: 20.0000 mg | ORAL_CAPSULE | Freq: Every day | ORAL | 0 refills | Status: AC

## 2022-08-12 MED ORDER — ARIPIPRAZOLE 10 MG PO TABS
10.0000 mg | ORAL_TABLET | Freq: Every day | ORAL | 0 refills | Status: AC
Start: 2022-08-12 — End: ?

## 2022-08-12 NOTE — Discharge Summary (Signed)
Physician Discharge Summary Note  Patient:  Deborah Arnold is an 52 y.o., female MRN:  [id number] DOB:  15-Jan-1971 Patient phone:  [phone] (home)  Patient address:   [address] [place] [address],  Total Time spent with patient: 45 minutes  Date of Admission:  08/08/2022 Date of Discharge: 08/13/2022  Reason for Admission:  [name] is a 52 year old Caucasian female with prior psychiatric history significant for schizoaffective disorder bipolar type, major depressive disorder recurrent episode moderate, anxiety state, cocaine use disorder, cannabis use disorder, and auditory hallucination. Patient presented voluntarily to [facility] for suicidal ideation by intentional overdosing on 700 mg of gabapentin in the context of having altercation with his significant other.   Principal Problem: Major depressive disorder, recurrent severe without psychotic features [facility]) Discharge Diagnoses: Principal Problem:   Major depressive disorder, recurrent severe without psychotic features (HCC)   Past Psychiatric History:  Previous Psych Diagnoses: Schizoaffective disorder, bipolar type, auditory hallucination, major depressive disorder without psychosis, generalized anxiety disorder. Prior inpatient treatment: Patient was admitted to [facility] 4 years ago Current/prior outpatient treatment: Receiving mental health therapy and medication management at [facility] Prior rehab hx: Patient denies Psychotherapy hx: Yes History of suicide: X 2 History of homicide or aggression: Denies Psychiatric medication history: Patient has been on trial Abilify, Prozac, and Neurontin. Psychiatric medication compliance history: Noncompliance at times Neuromodulation history: Denies  Current Psychiatrist: Yes, at [facility] Current therapist: Yes, at [facility]  Past Medical History:  Past Medical History:  Diagnosis  Date   Abortion    x2   Family history of anesthesia complication    " my son has malignant hypothermia "   Preterm labor    Urinary tract infection     Past Surgical History:  Procedure Laterality Date   INDUCED ABORTION     x2   KNEE CARTILAGE SURGERY  1/08   ORIF ANKLE FRACTURE Right 08/28/2012   Dr Lajoyce Corners   ORIF ANKLE FRACTURE Right 08/28/2012   Procedure: OPEN REDUCTION INTERNAL FIXATION (ORIF) RIGHT TRIMALEOLAR  ANKLE FRACTURE;  Surgeon: Nadara Mustard, MD;  Location: [facility] OR;  Service: Orthopedics;  Laterality: Right;   TUBAL LIGATION Bilateral 05/18/2012   Procedure: POST PARTUM TUBAL LIGATION;  Surgeon: Adam Phenix, MD;  Location: [facility] ORS;  Service: Gynecology;  Laterality: Bilateral;    Family History:  Family History  Problem Relation Age of Onset   Cancer Other        Breast-maternal side   Family Psychiatric  History:  Medical: Denies Psych: Denies Psych Rx: Denies SA/HA: Denies Substance use family hx: Denies Social History:  Social History   Substance and Sexual Activity  Alcohol Use Yes   Alcohol/week: 1.0 standard drink of alcohol   Types: 1 Cans of beer per week   Comment: one beer weekly usually     Social History   Substance and Sexual Activity  Drug Use Yes   Frequency: 7.0 times per week   Types: Marijuana, Cocaine    Social History   Socioeconomic History   Marital status: Widowed    Spouse name: Not on file   Number of children: 4   Years of education: 12   Highest education level: High school graduate  Occupational History   Not on file  Tobacco Use   Smoking status: Every Day    Packs/day: 1.00    Years: 26.00    Additional pack years:  0.00    Total pack years: 26.00    Types: Cigarettes    Last attempt to quit: 08/08/2022    Years since quitting: 0.0   Smokeless tobacco: Never  Vaping Use   Vaping Use: Never used  Substance and Sexual Activity   Alcohol use: Yes    Alcohol/week: 1.0 standard drink of alcohol    Types: 1 Cans  of beer per week    Comment: one beer weekly usually   Drug use: Yes    Frequency: 7.0 times per week    Types: Marijuana, Cocaine   Sexual activity: Yes    Birth control/protection: None  Other Topics Concern   Not on file  Social History Narrative   Not on file   Social Determinants of Health   Financial Resource Strain: Not on file  Food Insecurity: No Food Insecurity (08/08/2022)   Hunger Vital Sign    Worried About Running Out of Food in the Last Year: Never true    Ran Out of Food in the Last Year: Never true  Transportation Needs: No Transportation Needs (08/08/2022)   PRAPARE - Administrator, Civil Service (Medical): No    Lack of Transportation (Non-Medical): No  Physical Activity: Not on file  Stress: Not on file  Social Connections: Not on file   Childhood (bring, raised, lives now, parents, siblings, schooling, education): High school diploma  Abuse: Denies Marital Status: Single, has a boyfriend Sexual orientation: Female from birth Children: 40, ages 79, 57, 13, and 70 years old Employment: Employed at [employer] Peer Group: Denies Housing: Lives with boyfriend Finances: Has financial difficulty Legal: Yes, with DSS having her Presenter, broadcasting: Denies serving in the [employer]  Substance Use History:  Alcohol: Can of beer per week Tobacco: 1 pack of cigarettes per day Illicit drugs: $20 worth of crack cocaine.  Amphetamines not sure how much, 3 joints of marijuana per week Rx drug abuse: Denies Rehab hx: Denies  Hospital Course:   During the patient's hospitalization, patient had extensive initial psychiatric evaluation, and follow-up psychiatric evaluations every day.   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   During the hospitalization, other adjustments were made to the patient's psychiatric medication regimen: Prozac was continued 20 mg daily for depression, Abilify was titrated from 5 to 10 mg to help further with augmenting antidepressant and also to help with mood and psychosis given patient was complaining of auditory hallucinations at time of admission which improved significantly with medication adjustment, trazodone was continued at a dose of 50 mg at bedtime for sleep gabapentin was continued 100 mg at bedtime for substance use disorder and mood, patient also used Atarax 25 mg 3 times daily as needed for anxiety with good efficacy during hospital stay   Patient's care was discussed during the interdisciplinary team meeting every day during the hospitalization.   The patient denied having side effects to prescribed psychiatric medication.   Gradually, patient started adjusting to milieu. The patient was evaluated each day by a clinical provider to ascertain  response to treatment. Improvement was noted by the patient's report of decreasing symptoms, improved sleep and appetite, affect, medication tolerance, behavior, and participation in unit programming.  Patient was asked each day to complete a self inventory noting mood, mental status, pain, new symptoms, anxiety and concerns.     Symptoms were reported as significantly decreased or resolved completely by discharge.    On day of discharge, patient was evaluated on 6/14 the patient reports that their mood is stable. The patient denied having suicidal thoughts for more than 48 hours prior to discharge.  Patient denies having homicidal thoughts.  Patient denies having auditory hallucinations.   Patient denies any visual hallucinations or other symptoms of psychosis. The patient was motivated to continue taking medication with a goal of continued improvement in mental health.  During hospital stay patient presented goal and future oriented noting after discharge she will go back to work with plan on Monday to go see her children and visit arranged by DSS and also attending parenting classes as scheduled she has hopes to be able to get her children back by working and saving money to get her own place.  She denies craving to illicit drugs and was able to discuss crisis plan if worsening depression or SI after discharge, she also noted interest to comply with outpatient follow-up appointment including counseling and medication management. The patient reports their target psychiatric symptoms of depression, liters admission and SI responded well to the psychiatric medications, and the patient reports overall benefit other psychiatric hospitalization. Supportive psychotherapy was provided to the patient. The patient also participated in regular group therapy while hospitalized. Coping skills, problem solving as well as relaxation therapies were also part of the unit programming.   Labs were reviewed with the patient, and abnormal results were discussed with the patient.   The patient is able to verbalize their individual safety plan to this provider.   Behavioral Events: None   Restraints: None   Groups: Attended and participated   Medications Changes: As above   Sleep  Sleep:Sleep: Good Number of Hours of Sleep: 7    Physical Findings: AIMS: Facial and Oral Movements Muscles of Facial Expression: None, normal Lips and Perioral Area: None, normal Jaw: None, normal Tongue: None, normal,Extremity Movements Upper (arms, wrists, hands, fingers): None, normal Lower (legs, knees, ankles, toes): None, normal, Trunk Movements Neck, shoulders, hips: None, normal, Overall Severity Severity  of abnormal movements (highest score from questions above): None, normal Incapacitation due to abnormal movements: None, normal Patient's awareness of abnormal movements (rate only patient's report): No Awareness, Dental Status Current problems with teeth and/or dentures?: No Does patient usually wear dentures?: No  CIWA:  CIWA-Ar Total: 3 COWS:     Musculoskeletal: Strength & Muscle Tone: within normal limits Gait & Station: normal Patient leans: N/A   Psychiatric Specialty Exam:  General Appearance: appears at stated age, fairly dressed and groomed  Behavior: pleasant and cooperative  Psychomotor Activity:No psychomotor agitation or retardation noted   Eye Contact: good Speech: normal amount, tone, volume and latency   Mood: euthymic Affect: congruent, pleasant and interactive  Thought Process: linear, goal directed, no circumstantial or tangential thought process noted, no racing thoughts or flight of ideas Descriptions of Associations: intact Thought Content: Hallucinations: denies AH, VH , does not appear responding to stimuli Delusions: No paranoia or other delusions noted Suicidal Thoughts: denies SI, intention, plan  Homicidal Thoughts: denies HI, intention, plan   Alertness/Orientation: alert and fully oriented  Insight: fair, improved  Judgment: fair, improved  Memory: intact  Executive Functions  Concentration: intact  Attention Span: Fair Recall: intact Fund of Knowledge: fair   Assets  Assets: Manufacturing systems engineer; Desire for Improvement; Resilience; Physical Health   Sleep Sleep: Sleep: Good Number of Hours of Sleep: 7    Physical Exam:  Physical Exam Vitals and nursing note reviewed.  Constitutional:      Appearance: Normal appearance.  HENT:     Head: Normocephalic and atraumatic.     Nose: Nose normal.  Eyes:     Pupils: Pupils are equal, round, and reactive to light.  Pulmonary:     Effort: Pulmonary effort is normal.   Musculoskeletal:        General: Normal range of motion.     Cervical back: Normal range of motion.  Neurological:     General: No focal deficit present.     Mental Status: She is alert and oriented to person, place, and time. Mental status is at baseline.  Psychiatric:        Mood and Affect: Mood normal.        Behavior: Behavior normal.        Thought Content: Thought content normal.        Judgment: Judgment normal.    Review of Systems  All other systems reviewed and are negative.  Blood pressure (!) 143/72, pulse 90, temperature 98 F (36.7 C), temperature source Oral, resp. rate 14, height 5\' 3"  (1.6 m), weight 61.7 kg, SpO2 100 %. Body mass index is 24.09 kg/m.   Social History   Tobacco Use  Smoking Status Every Day   Packs/day: 1.00   Years: 26.00   Additional pack years: 0.00   Total pack years: 26.00   Types: Cigarettes   Last attempt to quit: 08/08/2022   Years since quitting: 0.0  Smokeless Tobacco Never   Tobacco Cessation:  A prescription for an FDA-approved tobacco cessation medication provided at discharge   Blood Alcohol level:  Lab Results  Component Value Date   [facility] <10 08/07/2022   ETH <5 12/31/2015    Metabolic Disorder Labs:  Lab Results  Component Value Date   HGBA1C 5.2 08/10/2022   MPG 102.54 08/10/2022   MPG 96.8 08/24/2018   No results found for: "PROLACTIN" Lab Results  Component Value Date   CHOL 177 08/10/2022   TRIG 64 08/10/2022   HDL 67 08/10/2022   CHOLHDL 2.6 08/10/2022   VLDL 13 08/10/2022   LDLCALC 97 08/10/2022   LDLCALC 91 08/24/2018    See Psychiatric Specialty Exam and Suicide Risk Assessment completed by Attending Physician prior to discharge.  Discharge destination:  Home plans to pick up her belongings from her boyfriend's place then stays at motel room until able to get her own place  Is patient on multiple antipsychotic therapies at discharge:  No   Has Patient had three or more failed trials of  antipsychotic monotherapy by history:  No  Recommended Plan for Multiple Antipsychotic Therapies: NA  Discharge Instructions     Diet - low sodium heart healthy   Complete by: As directed    Increase activity slowly   Complete by: As directed       Allergies as of 08/12/2022   No Known Allergies      Medication List     TAKE these medications      Indication  ARIPiprazole 10 MG tablet Commonly known as: ABILIFY Take 1 tablet (10 mg total) by mouth at bedtime.  What changed: Another medication with the same name was removed. Continue taking this medication, and follow the directions you see here.  Indication: Major Depressive Disorder   FLUoxetine 20 MG capsule Commonly known as: PROZAC Take 1 capsule (20 mg total) by mouth daily.  Indication: Major Depressive Disorder   gabapentin 100 MG capsule Commonly known as: NEURONTIN Take 1 capsule (100 mg total) by mouth every evening.  Indication: Abuse or Misuse of Alcohol   hydrOXYzine 25 MG tablet Commonly known as: ATARAX Take 1 tablet (25 mg total) by mouth 3 (three) times daily as needed for anxiety.  Indication: Feeling Anxious   nicotine 21 mg/24hr patch Commonly known as: NICODERM CQ - dosed in mg/24 hours Place 1 patch (21 mg total) onto the skin daily.  Indication: Nicotine Addiction   traZODone 50 MG tablet Commonly known as: DESYREL Take 1 tablet (50 mg total) by mouth at bedtime.  Indication: Trouble Sleeping        Follow-up Information     [facility]. Go to.   Specialty: Behavioral Health Why: You may go to this provider for an assessment, to obtain therapy and medication management services on Monday through Friday, arrive by 7:20 am.  The new policy is that once patients have accrued 2 no shows, they are no longer able to schedule appts. Contact information: [address] [place] [address] [phone]        [facility] Follow up on 08/23/2022.    Why: You have a hospital follow up appointment on 08/23/22 at 5:00 pm for therapy and medication management services.  The appointment will be via Virtual telehealth. Contact information: [address]  [address] [place] [address] [phone]         [facility] Go on 08/23/2022.   Why: You have a hospital follow up appointment with your primary care provider on  08/23/22 at 11:00 am. This appointment will be held in person.  Please call to confirm this appt. Contact information: [address] [place] [address] [phone]                 Discharge recommendations:    Activity: as tolerated  Diet: heart healthy  # It is recommended to the patient to continue psychiatric medications as prescribed, after discharge from the hospital.     # It is recommended to the patient to follow up with your outpatient psychiatric provider and PCP.   # It was discussed with the patient, the impact of alcohol, drugs, tobacco have been there overall psychiatric and medical wellbeing, and total abstinence from substance use was recommended the patient.ed.   # Prescriptions provided or sent directly to preferred pharmacy at discharge. Patient agreeable to plan. Given opportunity to ask questions. Appears to feel comfortable with discharge.    # In the event of worsening symptoms, the patient is instructed to call the crisis hotline, 911 and or go to the nearest ED for appropriate evaluation and treatment of symptoms. To follow-up with primary care provider for other medical issues, concerns and or health care needs   # Patient was discharged home with a plan to follow up as noted above.  -Follow-up with outpatient primary care doctor and other specialists -for management of chronic medical disease, including: Patient was noted to have some elevated blood pressure reading during hospital stay, she denies history of hypertension diagnosis, received 2 doses of clonidine as  needed during her hospital stay, questionable if high  blood pressure readings related to substance use relapse prior to admission. Patient was recommended to follow-up with primary care provider after discharge she [facility] [place], she agrees to comply with follow-up.    Patient agrees with D/C instructions and plan.   The patient received suicide prevention pamphlet:  Yes Belongings returned:  Clothing and Valuables  Total Time Spent in Direct Patient Care:  I personally spent 45 minutes on the unit in direct patient care. The direct patient care time included face-to-face time with the patient, reviewing the patient's chart, communicating with other professionals, and coordinating care. Greater than 50% of this time was spent in counseling or coordinating care with the patient regarding goals of hospitalization, psycho-education, and discharge planning needs.    SignedSarita Bottom, MD 08/12/2022, 9:52 AM

## 2022-08-12 NOTE — Progress Notes (Signed)
  [facility] Adult Case Management Discharge Plan :  Will you be returning to the same living situation after discharge:  Yes,  Mr. Phineas Real (BF) [phone] At discharge, do you have transportation home?: No. Taxi Do you have the ability to pay for your medications: Yes,     Release of information consent forms completed and in the chart;  Patient's signature needed at discharge.  Patient to Follow up at:  Follow-up Information     [facility]. Go to.   Specialty: Behavioral Health Why: You may go to this provider for an assessment, to obtain therapy and medication management services on Monday through Friday, arrive by 7:20 am.  The new policy is that once patients have accrued 2 no shows, they are no longer able to schedule appts. Contact information: [address] [place] [address] [phone]        [facility] Follow up on 08/23/2022.   Why: You have a hospital follow up appointment on 08/23/22 at 5:00 pm for therapy and medication management services.  The appointment will be via Virtual telehealth. Contact information: [address]  [address] [place] [address] [phone]                 Next level of care provider has access to [facility] Link:yes  Safety Planning and Suicide Prevention discussed: Yes,  Mr. Phineas Real (BF) [phone]     Has patient been referred to the Quitline?: Patient refused referral for treatment  Patient has been referred for addiction treatment: Yes, the patient will follow up with an outpatient provider for substance use disorder. Psychiatrist/APP: appointment made and Therapist: patient to schedule appointment Patient to continue working towards treatment goals after discharge. Patient no longer meets criteria for inpatient criteria per attending physician. Continue taking medications as prescribed, nursing to provide instructions at discharge. Follow up with all scheduled appointments.   Perla Echavarria S  Twyla Dais, LCSW 08/12/2022, 9:21 AM

## 2022-08-12 NOTE — Progress Notes (Signed)
   08/12/22 0628  Vital Signs  Temp 98 F (36.7 C)  Temp Source Oral  Pulse Rate 95  Pulse Rate Source Monitor  BP (!) 167/103  BP Location Left Arm  BP Method Automatic  Patient Position (if appropriate) Sitting  Oxygen Therapy  SpO2 100 %  O2 Device Room Air   PRN clonidine administered per MAR. No adverse drug reactions noted. Patient remains safe at this time.

## 2022-08-12 NOTE — Progress Notes (Signed)
Patient denies SI, HI and AVH. Reports mood is good and is ready to go home. Patient observed to be responding to internal stimuli. Patient is calm and cooperative, though irritable with questioning.   08/12/22 0800  Psych Admission Type (Psych Patients Only)  Admission Status Voluntary  Psychosocial Assessment  Patient Complaints None  Eye Contact Fair  Facial Expression Animated  Affect Irritable;Anxious  Speech Logical/coherent  Interaction Minimal  Motor Activity Other (Comment) (WDL)  Appearance/Hygiene Disheveled  Behavior Characteristics Irritable  Mood Irritable  Thought Process  Coherency WDL  Content WDL  Delusions None reported or observed  Perception WDL  Hallucination Auditory  Judgment Poor  Confusion None  Danger to Self  Current suicidal ideation? Denies  Agreement Not to Harm Self Yes  Description of Agreement verbal  Danger to Others  Danger to Others None reported or observed

## 2022-08-12 NOTE — Progress Notes (Signed)
   08/12/22 0615  15 Minute Checks  Location Bedroom  Visual Appearance Calm  Behavior Sleeping  Sleep (Behavioral Health Patients Only)  Calculate sleep? (Click Yes once per 24 hr at 0600 safety check) Yes  Documented sleep last 24 hours 8.75
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
## 2022-08-12 NOTE — Progress Notes (Signed)
Discharge Note:  Patient denies SI/HI/AVH at this time. Discharge instructions, AVS, prescriptions, and transition record gone over with patient. Patient agrees to comply with medication management, and follow-up visits. Patient belongings returned to patient. Patient questions and concerns addressed and answered. Patient ambulatory off unit.
